# Patient Record
Sex: Female | Born: 1950 | Race: White | Hispanic: No | Marital: Married
Health system: Southern US, Community
[De-identification: ages and names within clinical notes are randomized; demographics above are authoritative.]

---

## 1998-05-17 ENCOUNTER — Ambulatory Visit (HOSPITAL_COMMUNITY): Admission: RE | Admit: 1998-05-17 | Discharge: 1998-05-17 | Payer: Self-pay

## 1998-10-02 ENCOUNTER — Ambulatory Visit (HOSPITAL_COMMUNITY): Admission: RE | Admit: 1998-10-02 | Discharge: 1998-10-02 | Payer: Self-pay

## 2000-04-01 ENCOUNTER — Other Ambulatory Visit: Admission: RE | Admit: 2000-04-01 | Discharge: 2000-04-01 | Payer: Self-pay | Admitting: Internal Medicine

## 2001-09-09 ENCOUNTER — Emergency Department (HOSPITAL_COMMUNITY): Admission: EM | Admit: 2001-09-09 | Discharge: 2001-09-10 | Payer: Self-pay | Admitting: Emergency Medicine

## 2001-10-24 ENCOUNTER — Other Ambulatory Visit: Admission: RE | Admit: 2001-10-24 | Discharge: 2001-10-24 | Payer: Self-pay | Admitting: Internal Medicine

## 2002-02-07 ENCOUNTER — Other Ambulatory Visit: Admission: RE | Admit: 2002-02-07 | Discharge: 2002-02-07 | Payer: Self-pay | Admitting: Internal Medicine

## 2002-03-13 ENCOUNTER — Encounter (INDEPENDENT_AMBULATORY_CARE_PROVIDER_SITE_OTHER): Payer: Self-pay | Admitting: Specialist

## 2002-03-13 ENCOUNTER — Ambulatory Visit (HOSPITAL_COMMUNITY): Admission: RE | Admit: 2002-03-13 | Discharge: 2002-03-13 | Payer: Self-pay | Admitting: *Deleted

## 2003-11-01 ENCOUNTER — Other Ambulatory Visit: Admission: RE | Admit: 2003-11-01 | Discharge: 2003-11-01 | Payer: Self-pay | Admitting: *Deleted

## 2007-09-26 ENCOUNTER — Ambulatory Visit (HOSPITAL_COMMUNITY): Admission: RE | Admit: 2007-09-26 | Discharge: 2007-09-26 | Payer: Self-pay | Admitting: *Deleted

## 2009-10-29 ENCOUNTER — Encounter: Admission: RE | Admit: 2009-10-29 | Discharge: 2009-10-29 | Payer: Self-pay | Admitting: Obstetrics and Gynecology

## 2010-12-23 ENCOUNTER — Encounter
Admission: RE | Admit: 2010-12-23 | Discharge: 2010-12-23 | Payer: Self-pay | Source: Home / Self Care | Attending: Obstetrics and Gynecology | Admitting: Obstetrics and Gynecology

## 2011-04-14 NOTE — Op Note (Signed)
NAME:  Melissa [name], Melissa [name]             ACCOUNT NO.:  [id number]   MEDICAL RECORD NO.:  [id number]          PATIENT TYPE:  AMB   LOCATION:  ENDO                         FACILITY:  [facility]   PHYSICIAN:  Georgiana Spinner, M.D.    DATE OF BIRTH:  05/07/1951   DATE OF PROCEDURE:  09/26/2007  DATE OF DISCHARGE:                               OPERATIVE REPORT   PROCEDURE:  Colonoscopy.   INDICATIONS:  Colon cancer screening.   ANESTHESIA:  Fentanyl 100 mcg, Versed 10 mg.   PROCEDURE:  With the patient mildly sedated in the left lateral  decubitus position, the Pentax videoscopic colonoscope was inserted into  the rectum and passed under direct vision with pressure applied to reach  the cecum, identified by ileocecal valve and the base of cecum, both of  which were photographed.  From this point, the colonoscope was slowly  withdrawn taking circumferential views of the colonic mucosa, stopping  only in the rectum which appeared normal on direct and retroflex view.  The endoscope was straightened and withdrawn.  The patient's vital  signs, and pulse oximeter remained stable.  The patient tolerated the  procedure well without apparent complications.   FINDINGS:  Unremarkable examination.  Because of the patient's family  history we will repeat examination in approximately 5 years           ______________________________  Georgiana Spinner, M.D.     GMO/MEDQ  D:  09/26/2007  T:  09/26/2007  Job:  [id number]

## 2011-04-17 NOTE — Procedures (Signed)
[facility]  Patient:    [name], Melissa Visit Number: [id number] MRN: [id number]          Service Type: END Location: ENDO Attending Physician:  Sabino Gasser Dictated by:   Sabino Gasser, M.D. Proc. Date: 03/13/02 Admit Date:  03/13/2002                             Procedure Report  PROCEDURE:  Upper endoscopy.  INDICATION:  Reflux.  ANESTHESIA:  Demerol 50, Versed 6 mg.  DESCRIPTION OF PROCEDURE:  With patient mildly sedated in the left lateral decubitus position, the Olympus videoscopic endoscope was inserted into the mouth and passed under direct vision through the esophagus which appeared normal into the stomach; fundus, body, antrum, duodenal bulb, and second portion of duodenum all appeared normal.  From this point, the endoscope was slowly withdrawn, taking circumferential views of the entire gastric and subsequently esophageal mucosa, all of which appeared normal.  The patients vital signs and pulse oximeter remained stable.  The patient tolerated the procedure well without apparent complications.  FINDINGS:  This is an unremarkable endoscopic examination.  PLAN:  Proceed to colonoscopy. Dictated by:   Sabino Gasser, M.D. Attending Physician:  Sabino Gasser DD:  03/13/02 TD:  03/13/02 Job: [id number]

## 2011-04-17 NOTE — Procedures (Signed)
[facility]  Patient:    Melissa [name], Melissa [name] Visit Number: [id number] MRN: [id number]          Service Type: END Location: ENDO Attending Physician:  Sabino Gasser Dictated by:   Sabino Gasser, M.D. Proc. Date: 03/13/02 Admit Date:  03/13/2002                             Procedure Report  PROCEDURE:  Colonoscopy.  INDICATION:  Colon cancer screening.  ANESTHESIA:  Versed 2 mg.  DESCRIPTION OF PROCEDURE:  With the patient mildly sedated in the left lateral decubitus position, the Olympus videoscopic colonoscope was inserted into the rectum and passed under direct vision to the cecum, identified by the ileocecal valve and appendiceal orifice.  Both of which were photographed. From this point, the colonoscope was slowly withdrawn, taking circumferential views of the entire colonic mucosa, stopping only at 30 cm from the anal verge at which point a small polyp was seen, photographed, and removed using hot biopsy forceps technique setting of 3-3 blended current.  The endoscope was then withdrawn to the rectum which appeared normal on direct and retroflexed view.  The endoscope was straightened and withdrawn.  The patients vital signs and pulse oximeter remained stable.  The patient tolerated the procedure well without apparent complications.  FINDINGS:  Small polyp at 30 cm.  PLAN: 1. Await biopsy report. 2. The patient will call me for results and follow up with me as an    outpatient. Dictated by:   Sabino Gasser, M.D. Attending Physician:  Sabino Gasser DD:  03/13/02 TD:  03/13/02 Job: [id number]

## 2011-12-02 ENCOUNTER — Other Ambulatory Visit: Payer: Self-pay | Admitting: Obstetrics and Gynecology

## 2011-12-02 DIAGNOSIS — Z1231 Encounter for screening mammogram for malignant neoplasm of breast: Secondary | ICD-10-CM

## 2011-12-25 ENCOUNTER — Ambulatory Visit: Payer: Self-pay

## 2012-01-04 ENCOUNTER — Ambulatory Visit
Admission: RE | Admit: 2012-01-04 | Discharge: 2012-01-04 | Disposition: A | Discharge status: Home / Self Care | Payer: PRIVATE HEALTH INSURANCE | Source: Ambulatory Visit | Attending: Obstetrics and Gynecology | Admitting: Obstetrics and Gynecology

## 2012-01-04 DIAGNOSIS — Z1231 Encounter for screening mammogram for malignant neoplasm of breast: Secondary | ICD-10-CM

## 2012-01-11 ENCOUNTER — Other Ambulatory Visit: Payer: Self-pay | Admitting: Obstetrics and Gynecology

## 2012-01-11 DIAGNOSIS — R928 Other abnormal and inconclusive findings on diagnostic imaging of breast: Secondary | ICD-10-CM

## 2012-01-20 ENCOUNTER — Ambulatory Visit
Admission: RE | Admit: 2012-01-20 | Discharge: 2012-01-20 | Disposition: A | Discharge status: Home / Self Care | Payer: PRIVATE HEALTH INSURANCE | Source: Ambulatory Visit | Attending: Obstetrics and Gynecology | Admitting: Obstetrics and Gynecology

## 2012-01-20 DIAGNOSIS — R928 Other abnormal and inconclusive findings on diagnostic imaging of breast: Secondary | ICD-10-CM

## 2012-06-20 ENCOUNTER — Other Ambulatory Visit: Payer: Self-pay | Admitting: Dermatology

## 2013-02-16 ENCOUNTER — Other Ambulatory Visit: Payer: Self-pay | Admitting: Dermatology

## 2013-02-20 ENCOUNTER — Other Ambulatory Visit: Payer: Self-pay

## 2013-02-20 DIAGNOSIS — Z1231 Encounter for screening mammogram for malignant neoplasm of breast: Secondary | ICD-10-CM

## 2013-03-15 ENCOUNTER — Ambulatory Visit
Admission: RE | Admit: 2013-03-15 | Discharge: 2013-03-15 | Disposition: A | Discharge status: Home / Self Care | Payer: Commercial Managed Care - PPO | Source: Ambulatory Visit

## 2013-03-15 DIAGNOSIS — Z1231 Encounter for screening mammogram for malignant neoplasm of breast: Secondary | ICD-10-CM

## 2014-09-17 ENCOUNTER — Other Ambulatory Visit: Payer: Self-pay | Admitting: Dermatology

## 2017-02-01 DIAGNOSIS — E559 Vitamin D deficiency, unspecified: Secondary | ICD-10-CM | POA: Diagnosis not present

## 2017-02-01 DIAGNOSIS — D72819 Decreased white blood cell count, unspecified: Secondary | ICD-10-CM | POA: Diagnosis not present

## 2017-02-01 DIAGNOSIS — M8580 Other specified disorders of bone density and structure, unspecified site: Secondary | ICD-10-CM | POA: Diagnosis not present

## 2017-02-02 DIAGNOSIS — R69 Illness, unspecified: Secondary | ICD-10-CM | POA: Diagnosis not present

## 2017-02-18 DIAGNOSIS — Z85828 Personal history of other malignant neoplasm of skin: Secondary | ICD-10-CM | POA: Diagnosis not present

## 2017-02-18 DIAGNOSIS — Z880 Allergy status to penicillin: Secondary | ICD-10-CM | POA: Diagnosis not present

## 2017-02-18 DIAGNOSIS — Z23 Encounter for immunization: Secondary | ICD-10-CM | POA: Diagnosis not present

## 2017-02-18 DIAGNOSIS — M8589 Other specified disorders of bone density and structure, multiple sites: Secondary | ICD-10-CM | POA: Diagnosis not present

## 2017-02-18 DIAGNOSIS — Z0000 Encounter for general adult medical examination without abnormal findings: Secondary | ICD-10-CM | POA: Diagnosis not present

## 2017-03-15 DIAGNOSIS — H5203 Hypermetropia, bilateral: Secondary | ICD-10-CM | POA: Diagnosis not present

## 2017-03-15 DIAGNOSIS — H524 Presbyopia: Secondary | ICD-10-CM | POA: Diagnosis not present

## 2017-03-15 DIAGNOSIS — H2513 Age-related nuclear cataract, bilateral: Secondary | ICD-10-CM | POA: Diagnosis not present

## 2017-04-12 DIAGNOSIS — M545 Low back pain: Secondary | ICD-10-CM | POA: Diagnosis not present

## 2017-04-16 DIAGNOSIS — M545 Low back pain: Secondary | ICD-10-CM | POA: Diagnosis not present

## 2017-04-30 DIAGNOSIS — M545 Low back pain: Secondary | ICD-10-CM | POA: Diagnosis not present

## 2017-05-05 DIAGNOSIS — M545 Low back pain: Secondary | ICD-10-CM | POA: Diagnosis not present

## 2017-05-12 DIAGNOSIS — M545 Low back pain: Secondary | ICD-10-CM | POA: Diagnosis not present

## 2017-05-20 DIAGNOSIS — M545 Low back pain: Secondary | ICD-10-CM | POA: Diagnosis not present

## 2017-06-15 DIAGNOSIS — M545 Low back pain: Secondary | ICD-10-CM | POA: Diagnosis not present

## 2017-06-30 DIAGNOSIS — M545 Low back pain: Secondary | ICD-10-CM | POA: Diagnosis not present

## 2017-07-14 DIAGNOSIS — M545 Low back pain: Secondary | ICD-10-CM | POA: Diagnosis not present

## 2017-07-28 DIAGNOSIS — M545 Low back pain: Secondary | ICD-10-CM | POA: Diagnosis not present

## 2017-08-11 DIAGNOSIS — R69 Illness, unspecified: Secondary | ICD-10-CM | POA: Diagnosis not present

## 2017-09-30 DIAGNOSIS — S51802A Unspecified open wound of left forearm, initial encounter: Secondary | ICD-10-CM | POA: Diagnosis not present

## 2017-09-30 DIAGNOSIS — S63502A Unspecified sprain of left wrist, initial encounter: Secondary | ICD-10-CM | POA: Diagnosis not present

## 2017-10-13 DIAGNOSIS — Z23 Encounter for immunization: Secondary | ICD-10-CM | POA: Diagnosis not present

## 2018-01-31 DIAGNOSIS — Z124 Encounter for screening for malignant neoplasm of cervix: Secondary | ICD-10-CM | POA: Diagnosis not present

## 2018-01-31 DIAGNOSIS — Z01419 Encounter for gynecological examination (general) (routine) without abnormal findings: Secondary | ICD-10-CM | POA: Diagnosis not present

## 2018-01-31 DIAGNOSIS — Z1231 Encounter for screening mammogram for malignant neoplasm of breast: Secondary | ICD-10-CM | POA: Diagnosis not present

## 2018-02-01 DIAGNOSIS — Z124 Encounter for screening for malignant neoplasm of cervix: Secondary | ICD-10-CM | POA: Diagnosis not present

## 2018-02-16 DIAGNOSIS — M8580 Other specified disorders of bone density and structure, unspecified site: Secondary | ICD-10-CM | POA: Diagnosis not present

## 2018-02-16 DIAGNOSIS — Z13220 Encounter for screening for lipoid disorders: Secondary | ICD-10-CM | POA: Diagnosis not present

## 2018-02-16 DIAGNOSIS — Z0000 Encounter for general adult medical examination without abnormal findings: Secondary | ICD-10-CM | POA: Diagnosis not present

## 2018-02-21 DIAGNOSIS — Z85820 Personal history of malignant melanoma of skin: Secondary | ICD-10-CM | POA: Diagnosis not present

## 2018-02-21 DIAGNOSIS — M8580 Other specified disorders of bone density and structure, unspecified site: Secondary | ICD-10-CM | POA: Diagnosis not present

## 2018-02-21 DIAGNOSIS — Z8639 Personal history of other endocrine, nutritional and metabolic disease: Secondary | ICD-10-CM | POA: Diagnosis not present

## 2018-02-21 DIAGNOSIS — Z803 Family history of malignant neoplasm of breast: Secondary | ICD-10-CM | POA: Diagnosis not present

## 2018-02-21 DIAGNOSIS — Z85828 Personal history of other malignant neoplasm of skin: Secondary | ICD-10-CM | POA: Diagnosis not present

## 2018-02-21 DIAGNOSIS — Z23 Encounter for immunization: Secondary | ICD-10-CM | POA: Diagnosis not present

## 2018-02-21 DIAGNOSIS — Z0000 Encounter for general adult medical examination without abnormal findings: Secondary | ICD-10-CM | POA: Diagnosis not present

## 2018-02-21 DIAGNOSIS — D72819 Decreased white blood cell count, unspecified: Secondary | ICD-10-CM | POA: Diagnosis not present

## 2018-02-22 DIAGNOSIS — R69 Illness, unspecified: Secondary | ICD-10-CM | POA: Diagnosis not present

## 2018-03-16 DIAGNOSIS — H04123 Dry eye syndrome of bilateral lacrimal glands: Secondary | ICD-10-CM | POA: Diagnosis not present

## 2018-03-16 DIAGNOSIS — H2513 Age-related nuclear cataract, bilateral: Secondary | ICD-10-CM | POA: Diagnosis not present

## 2018-03-16 DIAGNOSIS — H5203 Hypermetropia, bilateral: Secondary | ICD-10-CM | POA: Diagnosis not present

## 2018-03-16 DIAGNOSIS — H524 Presbyopia: Secondary | ICD-10-CM | POA: Diagnosis not present

## 2018-03-24 DIAGNOSIS — Z85820 Personal history of malignant melanoma of skin: Secondary | ICD-10-CM | POA: Diagnosis not present

## 2018-03-24 DIAGNOSIS — L821 Other seborrheic keratosis: Secondary | ICD-10-CM | POA: Diagnosis not present

## 2018-03-24 DIAGNOSIS — Z85828 Personal history of other malignant neoplasm of skin: Secondary | ICD-10-CM | POA: Diagnosis not present

## 2018-03-24 DIAGNOSIS — D485 Neoplasm of uncertain behavior of skin: Secondary | ICD-10-CM | POA: Diagnosis not present

## 2018-03-24 DIAGNOSIS — D2271 Melanocytic nevi of right lower limb, including hip: Secondary | ICD-10-CM | POA: Diagnosis not present

## 2018-03-24 DIAGNOSIS — Z86018 Personal history of other benign neoplasm: Secondary | ICD-10-CM | POA: Diagnosis not present

## 2018-03-24 DIAGNOSIS — D225 Melanocytic nevi of trunk: Secondary | ICD-10-CM | POA: Diagnosis not present

## 2018-04-14 DIAGNOSIS — L988 Other specified disorders of the skin and subcutaneous tissue: Secondary | ICD-10-CM | POA: Diagnosis not present

## 2018-04-14 DIAGNOSIS — D485 Neoplasm of uncertain behavior of skin: Secondary | ICD-10-CM | POA: Diagnosis not present

## 2018-08-31 DIAGNOSIS — R69 Illness, unspecified: Secondary | ICD-10-CM | POA: Diagnosis not present

## 2018-09-16 DIAGNOSIS — Z23 Encounter for immunization: Secondary | ICD-10-CM | POA: Diagnosis not present

## 2019-05-02 DIAGNOSIS — H524 Presbyopia: Secondary | ICD-10-CM | POA: Diagnosis not present

## 2019-05-02 DIAGNOSIS — H5203 Hypermetropia, bilateral: Secondary | ICD-10-CM | POA: Diagnosis not present

## 2019-05-02 DIAGNOSIS — H04123 Dry eye syndrome of bilateral lacrimal glands: Secondary | ICD-10-CM | POA: Diagnosis not present

## 2019-05-02 DIAGNOSIS — H2513 Age-related nuclear cataract, bilateral: Secondary | ICD-10-CM | POA: Diagnosis not present

## 2019-05-30 DIAGNOSIS — R69 Illness, unspecified: Secondary | ICD-10-CM | POA: Diagnosis not present

## 2019-08-25 DIAGNOSIS — Z23 Encounter for immunization: Secondary | ICD-10-CM | POA: Diagnosis not present

## 2019-11-30 ENCOUNTER — Other Ambulatory Visit: Payer: Self-pay

## 2019-11-30 ENCOUNTER — Ambulatory Visit: Payer: Self-pay | Attending: Internal Medicine

## 2019-11-30 DIAGNOSIS — Z20822 Contact with and (suspected) exposure to covid-19: Secondary | ICD-10-CM

## 2019-11-30 DIAGNOSIS — Z20828 Contact with and (suspected) exposure to other viral communicable diseases: Secondary | ICD-10-CM | POA: Insufficient documentation

## 2019-12-02 LAB — NOVEL CORONAVIRUS, NAA: SARS-CoV-2, NAA: NOT DETECTED

## 2019-12-20 ENCOUNTER — Ambulatory Visit: Payer: Self-pay | Attending: Internal Medicine

## 2019-12-20 DIAGNOSIS — R69 Illness, unspecified: Secondary | ICD-10-CM | POA: Diagnosis not present

## 2019-12-20 DIAGNOSIS — Z23 Encounter for immunization: Secondary | ICD-10-CM | POA: Insufficient documentation

## 2019-12-20 NOTE — Progress Notes (Signed)
   Covid-19 Vaccination Clinic  Name:  Melissa [name]    MRN: [id number] DOB: [birth year]  12/20/2019  [name] was observed post Covid-19 immunization for 15 minutes without incidence. She was provided with Vaccine Information Sheet and instruction to access the V-Safe system.   [name] was instructed to call 911 with any severe reactions post vaccine: Marland Kitchen Difficulty breathing  . Swelling of your face and throat  . A fast heartbeat  . A bad rash all over your body  . Dizziness and weakness    Immunizations Administered    Name Date Dose VIS Date Route   Pfizer COVID-19 Vaccine 12/20/2019 10:36 AM 0.3 mL 11/10/2019 Intramuscular   Manufacturer: [place]   Lot: [id number]   [place]: [id number]

## 2020-01-08 ENCOUNTER — Ambulatory Visit: Payer: Self-pay | Attending: Internal Medicine

## 2020-01-08 DIAGNOSIS — Z23 Encounter for immunization: Secondary | ICD-10-CM | POA: Insufficient documentation

## 2020-01-08 NOTE — Progress Notes (Signed)
   Covid-19 Vaccination Clinic  Name:  ARIEA AZZARA    MRN: [id number] DOB: Jan 21, 1951  01/08/2020  Ms. Deshay was observed post Covid-19 immunization for 30 minutes based on pre-vaccination screening without incidence. She was provided with Vaccine Information Sheet and instruction to access the V-Safe system.   Ms. Lawrenson was instructed to call 911 with any severe reactions post vaccine: Marland Kitchen Difficulty breathing  . Swelling of your face and throat  . A fast heartbeat  . A bad rash all over your body  . Dizziness and weakness    Immunizations Administered    Name Date Dose VIS Date Route   Pfizer COVID-19 Vaccine 01/08/2020  3:39 PM 0.3 mL 11/10/2019 Intramuscular   Manufacturer: [place]   Lot: [id number]   [place]: [id number]

## 2020-02-21 DIAGNOSIS — D72819 Decreased white blood cell count, unspecified: Secondary | ICD-10-CM | POA: Diagnosis not present

## 2020-02-21 DIAGNOSIS — Z0000 Encounter for general adult medical examination without abnormal findings: Secondary | ICD-10-CM | POA: Diagnosis not present

## 2020-02-28 DIAGNOSIS — Z0000 Encounter for general adult medical examination without abnormal findings: Secondary | ICD-10-CM | POA: Diagnosis not present

## 2020-02-28 DIAGNOSIS — Z85820 Personal history of malignant melanoma of skin: Secondary | ICD-10-CM | POA: Diagnosis not present

## 2020-02-28 DIAGNOSIS — M8589 Other specified disorders of bone density and structure, multiple sites: Secondary | ICD-10-CM | POA: Diagnosis not present

## 2020-02-28 DIAGNOSIS — M8580 Other specified disorders of bone density and structure, unspecified site: Secondary | ICD-10-CM | POA: Diagnosis not present

## 2020-02-28 DIAGNOSIS — M25512 Pain in left shoulder: Secondary | ICD-10-CM | POA: Diagnosis not present

## 2020-02-28 DIAGNOSIS — Z85828 Personal history of other malignant neoplasm of skin: Secondary | ICD-10-CM | POA: Diagnosis not present

## 2020-02-28 DIAGNOSIS — S80812A Abrasion, left lower leg, initial encounter: Secondary | ICD-10-CM | POA: Diagnosis not present

## 2020-03-08 ENCOUNTER — Other Ambulatory Visit: Payer: Self-pay

## 2020-03-08 ENCOUNTER — Ambulatory Visit: Payer: Medicare HMO | Admitting: Family Medicine

## 2020-03-08 ENCOUNTER — Ambulatory Visit (INDEPENDENT_AMBULATORY_CARE_PROVIDER_SITE_OTHER): Payer: Medicare HMO

## 2020-03-08 ENCOUNTER — Other Ambulatory Visit: Payer: Self-pay | Admitting: Internal Medicine

## 2020-03-08 VITALS — BP 118/82 | HR 74 | Ht 64.0 in | Wt 137.0 lb

## 2020-03-08 DIAGNOSIS — M25551 Pain in right hip: Secondary | ICD-10-CM | POA: Diagnosis not present

## 2020-03-08 DIAGNOSIS — M25512 Pain in left shoulder: Secondary | ICD-10-CM | POA: Diagnosis not present

## 2020-03-08 DIAGNOSIS — Z1231 Encounter for screening mammogram for malignant neoplasm of breast: Secondary | ICD-10-CM

## 2020-03-08 NOTE — Progress Notes (Signed)
Subjective:   I, Melissa [name], am serving as a scribe for Dr. Hulan Saas. This visit occurred during the SARS-CoV-2 public health emergency.  Safety protocols were in place, including screening questions prior to the visit, additional usage of staff PPE, and extensive cleaning of exam room while observing appropriate contact time as indicated for disinfecting solutions.   CC: L shoulder pain  I, Melissa [name], LAT, ATC, am serving as scribe for Dr. Lynne Leader.  HPI: Pt is a 69 y/o female presenting w/ c/o L shoulder pain after tripping while running last winter. No fall. Pain in left scapula and posterior aspect of shoulder. Reaching over head and putting her bra on increases her pain. Denies any radiating symptoms.     She also injured her R groin during this episode. Feels like her leg is going to give out at times but is able to run without pain. Walking is worse especially downhill.   Radiating pain: L shoulder mechanical symptoms: Aggravating factors: Treatments tried:  Pertinent review of Systems: No fevers or chills  Relevant historical information: Avid runner.  Plans to run a half marathon in May at [place] in [place]   Objective:    Vitals:   03/08/20 0810  BP: 118/82  Pulse: 74  SpO2: 98%   General: Well Developed, well nourished, and in no acute distress.   MSK:  C-spine: Normal.  Normal cervical motion appreciably strength intact. Left shoulder normal-appearing nontender normal abduction and external rotation range of motion.  Internal rotation range of motion limited to lumbar spine. Strength intact abduction external/internal rotation. Negative Hawkins and Neer's test. Negative Yergason's and speeds test.  Contralateral right shoulder normal-appearing nontender normal motion internal rotation to thoracic spine. Normal strength negative impingement testing.  Pulses cap refill and sensation intact upper extremities bilaterally.  Right  hip normal-appearing nontender. Normal motion pain with flexion and internal rotation. Intact strength without pain.  Leg length equal bilaterally.  Lab and Radiology Results  X-ray images left shoulder and right hip obtained today personally and independently reviewed  Left shoulder: Mild degenerative changes no acute fractures.  Right hip: Largely normal-appearing no acute fractures.  Minimal degenerative changes.  Await formal radiology review   Impression and Recommendations:    Assessment and Plan: 69 y.o. female with  Left shoulder pain: Ongoing for months now.  Patient likely has a little impingement with some scapular dysfunction.  Plan for trial of physical therapy and recheck in about 6 weeks.  Anterior hip pain: Somewhat concerning for intra-articular hip pain or labrum tear.  X-ray today shows a little bit of degenerative changes but nothing severe per my read.  Overread is still pending.  Again trial of physical therapy.  Recheck if not improved will consider injection if not better.Marland Kitchen  PDMP not reviewed this encounter. Orders Placed This Encounter  Procedures  . DG Shoulder Left    Standing Status:   Future    Number of Occurrences:   1    Standing Expiration Date:   05/08/2021    Order Specific Question:   Reason for Exam (SYMPTOM  OR DIAGNOSIS REQUIRED)    Answer:   eval left shoulder pain x months    Order Specific Question:   Preferred imaging location?    Answer:   [facility]    Order Specific Question:   Radiology Contrast Protocol - do NOT remove file path    Answer:   \\[email or web address]\[email or web address]\Radiant\DXFluoroContrastProtocols.pdf  . DG HIP UNILAT WITH  PELVIS 2-3 VIEWS RIGHT    Standing Status:   Future    Number of Occurrences:   1    Standing Expiration Date:   05/08/2021    Order Specific Question:   Reason for Exam (SYMPTOM  OR DIAGNOSIS REQUIRED)    Answer:   eval right anterior hip pain    Order Specific Question:   Preferred imaging location?      Answer:   [facility]    Order Specific Question:   Radiology Contrast Protocol - do NOT remove file path    Answer:   \\[email or web address]\[email or web address]\Radiant\DXFluoroContrastProtocols.pdf  . Ambulatory referral to Physical Therapy    Referral Priority:   Routine    Referral Type:   Physical Medicine    Referral Reason:   Specialty Services Required    Requested Specialty:   Physical Therapy   No orders of the defined types were placed in this encounter.   Discussed warning signs or symptoms. Please see discharge instructions. Patient expresses understanding.   The above documentation has been reviewed and is accurate and complete Lynne Leader   Will send note to PCP Dr Conley [facility] [address] [place], [place] [address] Phone: [phone] Fax: [phone]

## 2020-03-08 NOTE — Progress Notes (Signed)
X-ray left shoulder shows no fractures.  Looks pretty normal per radiology.

## 2020-03-08 NOTE — Progress Notes (Signed)
X-ray right hip no fracture no significant arthritis.  Normal per radiology.

## 2020-03-08 NOTE — Patient Instructions (Addendum)
Thank you for coming in today.  Plan for shoulder and hip PT.  Get xray today on your way out.  Recheck with me prior your 1/2 marathon in May.  Let Lauren know about your running plan.   You should hear from my office soon about Xray results.   Bring your running gear with you so I can look at your run.

## 2020-03-20 ENCOUNTER — Other Ambulatory Visit: Payer: Self-pay

## 2020-03-20 ENCOUNTER — Ambulatory Visit: Payer: Medicare HMO | Admitting: Physical Therapy

## 2020-03-20 DIAGNOSIS — M25512 Pain in left shoulder: Secondary | ICD-10-CM

## 2020-03-20 DIAGNOSIS — M25551 Pain in right hip: Secondary | ICD-10-CM | POA: Diagnosis not present

## 2020-03-21 ENCOUNTER — Encounter: Payer: Medicare HMO | Admitting: Physical Therapy

## 2020-03-22 ENCOUNTER — Ambulatory Visit: Payer: Medicare HMO | Admitting: Physical Therapy

## 2020-03-22 ENCOUNTER — Other Ambulatory Visit: Payer: Self-pay

## 2020-03-22 DIAGNOSIS — M25512 Pain in left shoulder: Secondary | ICD-10-CM

## 2020-03-22 DIAGNOSIS — M25551 Pain in right hip: Secondary | ICD-10-CM

## 2020-03-22 NOTE — Patient Instructions (Signed)
Access Code: [email or web address] URL: [email or web address] Date: 03/22/2020 Prepared by: Lyndee Hensen  Exercises Supine Shoulder Flexion with Dowel - 2 x daily - 2 sets - 10 reps Supine Shoulder External Rotation with Dowel - 2 x daily - 2 sets - 10 reps Shoulder External Rotation with Anchored Resistance - 1 x daily - 2 sets - 10 reps Shoulder Internal Rotation with Resistance - 1 x daily - 2 sets - 10 reps Scapular Retraction with Resistance - 1 x daily - 2 sets - 10 reps Supine Single Knee to Chest Stretch - 2 x daily - 3 reps - 30 hold Supine Figure 4 Piriformis Stretch - 2 x daily - 3 reps - 30 hold Half Kneeling Hip Flexor Stretch - 2 x daily - 3 reps - 30 hold Sidelying Hip Abduction - 1 x daily - 2 sets - 10 reps Standing Repeated Hip Abduction with Resistance - 1 x daily - 2 sets - 10 reps Hooklying Isometric Clamshell - 1 x daily - 2 sets - 10 reps Supine Bridge - 1 x daily - 2 sets - 10 reps Single Leg Running Balance - 1 x daily - 1 sets - 10 reps Forward Reach - 1 x daily - 1 sets - 10 reps

## 2020-03-23 ENCOUNTER — Encounter: Payer: Self-pay | Admitting: Physical Therapy

## 2020-03-23 NOTE — Patient Instructions (Addendum)
Access Code: [email or web address] URL: [email or web address] Date: 03/22/2020 Prepared by: Lyndee Hensen  Exercises Supine Shoulder Flexion with Dowel - 2 x daily - 2 sets - 10 reps Supine Shoulder External Rotation with Dowel - 2 x daily - 2 sets - 10 reps   Supine Single Knee to Chest Stretch - 2 x daily - 3 reps - 30 hold Supine Figure 4 Piriformis Stretch - 2 x daily - 3 reps - 30 hold Half Kneeling Hip Flexor Stretch - 2 x daily - 3 reps - 30 hold

## 2020-03-23 NOTE — Therapy (Signed)
[facility] [address] [place], [place], [address] Phone: [phone]   Fax:  [phone]  Physical Therapy Treatment  Patient Details  Name: Melissa [name] MRN: [id number] Date of Birth: 1951/01/07 Referring Provider (PT): Lynne Leader   Encounter Date: 03/22/2020  PT End of Session - 03/23/20 1319    Visit Number  2    Number of Visits  12    Date for PT Re-Evaluation  05/01/20    Authorization Type  [employer]    PT Start Time  1301    PT Stop Time  1344    PT Time Calculation (min)  43 min    Activity Tolerance  Patient tolerated treatment well    Behavior During Therapy  [facility] for tasks assessed/performed       History reviewed. No pertinent past medical history.  History reviewed. No pertinent surgical history.  There were no vitals filed for this visit.  Subjective Assessment - 03/23/20 1318    Subjective  Pt states mild soreness in shoulder. Hip not too bad today. Pt running 1/2 marathon on May 2.    Currently in Pain?  Yes    Pain Score  4     Pain Location  Shoulder    Pain Orientation  Left    Pain Descriptors / Indicators  Aching    Pain Type  Acute pain    Pain Onset  More than a month ago    Pain Frequency  Intermittent    Pain Score  3    Pain Location  Hip    Pain Orientation  Right    Pain Descriptors / Indicators  Aching    Pain Type  Acute pain    Pain Onset  More than a month ago    Pain Frequency  Intermittent                       [facility] Adult PT Treatment/Exercise - 03/23/20 1322      Ambulation/Gait   Gait Comments  Gait: unremarkable, Running mechanis, mild decrease in hip extension on R,       Exercises   Exercises  Shoulder;Knee/Hip      Knee/Hip Exercises: Stretches   Piriformis Stretch  2 reps;30 seconds    Piriformis Stretch Limitations  supine     Other Knee/Hip Stretches  SKTC 30 sec x 3;      Other Knee/Hip Stretches  Hip flexor kneeling stretch 30 sec x 3  bil;       Knee/Hip Exercises: Standing   Hip Abduction  2 sets;10 reps    Abduction Limitations  YTB    SLS with Vectors  SLS with reach fwd x10 bil; SLS with runners lunge x10 bil;       Knee/Hip Exercises: Supine   Bridges  15 reps    Other Supine Knee/Hip Exercises  Clams GTB x20       Knee/Hip Exercises: Sidelying   Hip ABduction  10 reps;Both      Shoulder Exercises: Supine   Flexion  15 reps;AAROM    Flexion Limitations  cane      Shoulder Exercises: Standing   External Rotation  15 reps    Theraband Level (Shoulder External Rotation)  Level 2 (Red)    Internal Rotation  15 reps    Theraband Level (Shoulder Internal Rotation)  Level 2 (Red)    Row  20 reps    Theraband Level (Shoulder Row)  Level 3 (Green)      Shoulder Exercises: Stretch   Internal Rotation Stretch  5 reps    Internal Rotation Stretch Limitations  behind back, 10 sec holds      Manual Therapy   Manual Therapy  Joint mobilization;Passive ROM;Taping    Joint Mobilization  HIp Inf and post mobs, LAD  On R;     Passive ROM  PROM for L shoulder , all motions. IR mob behind back.     Kinesiotex  Ligament Correction      Kinesiotix   Ligament Correction  1 I strip and 1 Y strip for shoulder stabiliy              PT Education - 03/23/20 1319    Education Details  HEP updated    Person(s) Educated  Patient    Methods  Explanation;Demonstration;Handout;Verbal cues    Comprehension  Verbalized understanding;Returned demonstration;Verbal cues required;Tactile cues required;Need further instruction       PT Short Term Goals - 03/23/20 1251      PT SHORT TERM GOAL #1   Title  Pt to be independent with initial HEP    Time  2    Period  Weeks    Status  New    Target Date  04/03/20        PT Long Term Goals - 03/23/20 1253      PT LONG TERM GOAL #1   Title  Pt to be independent wtih final HEP    Time  6    Period  Weeks    Status  New    Target Date  05/02/20      PT LONG TERM GOAL  #2   Title  Pt to report decreased pain in L shoulder and R hip,  to 0-2/10 with exercise and IADLS.    Time  6    Period  Weeks    Status  New    Target Date  05/02/20      PT LONG TERM GOAL #3   Title  Pt to demo full AROM of L shoulder without pain, to improve ability for IADLs.    Time  6    Period  Weeks    Status  New    Target Date  05/02/20      PT LONG TERM GOAL #4   Title  Pt to demo improved strength and stability in R hip to be 5/5, and WNL for [facility]. , to improve ability for functional activities.    Time  6    Period  Weeks    Status  New    Target Date  05/02/20            Plan - 03/23/20 1320    Clinical Impression Statement  Focus on hip mobilization for pain, and start of hip strength and stabilization. Will resume hip strengthening after pts race, as to not create DOMS prior to race next week. Pt with mild improvements in shoulder ROM today, still sore with full ROM. Trial for stability taping for L shoulder today. Will assess effects and progress HEp next visit.    Examination-Activity Limitations  Reach Overhead;Squat;Stairs;Lift    Examination-Participation Restrictions  Cleaning;Driving;Shop;Yard Work    Stability/Clinical Decision Making  Stable/Uncomplicated    Rehab Potential  Good    PT Frequency  2x / week    PT Duration  6 weeks    PT Treatment/Interventions  ADLs/Self Care Home Management;Cryotherapy;Electrical Stimulation;Gait training;DME Instruction;Ultrasound;Traction;Moist Heat;Iontophoresis  4mg /ml Dexamethasone;Stair training;Functional mobility training;Therapeutic activities;Therapeutic exercise;Balance training;Neuromuscular re-education;Manual techniques;Patient/family education;Orthotic Fit/Training;Passive range of motion;Dry needling;Taping;Vasopneumatic Device;Spinal Manipulations;Joint Manipulations    Consulted and Agree with Plan of Care  Patient       Patient will benefit from skilled therapeutic intervention in order to improve  the following deficits and impairments:  Decreased range of motion, Decreased activity tolerance, Pain, Impaired flexibility, Decreased strength, Decreased mobility  Visit Diagnosis: Acute pain of left shoulder  Pain in right hip     Problem List There are no problems to display for this patient.   Lyndee Hensen, PT, DPT 1:26 PM  03/23/20    [place], [place], [address] Phone: [phone]   Fax:  [phone]  Name: JUREA CECCARELLI MRN: [id number] Date of Birth: Jun 17, 1951

## 2020-03-23 NOTE — Therapy (Signed)
[facility] [address] [place], [place], [address] Phone: [phone]   Fax:  [phone]  Physical Therapy Evaluation  Patient Details  Name: Melissa [name] MRN: [id number] Date of Birth: January 01, 1951 Referring Provider (PT): Lynne Leader   Encounter Date: 03/20/2020  PT End of Session - 03/23/20 1248    Visit Number  1    Number of Visits  12    Date for PT Re-Evaluation  05/01/20    Authorization Type  [employer] Medicare    PT Start Time  [id number]    PT Stop Time  1600    PT Time Calculation (min)  44 min    Activity Tolerance  Patient tolerated treatment well    Behavior During Therapy  [facility] for tasks assessed/performed       History reviewed. No pertinent past medical history.  History reviewed. No pertinent surgical history.  There were no vitals filed for this visit.       [facility] PT Assessment - 03/23/20 0001      Assessment   Medical Diagnosis  L shoulder pain/ R hip pain    Referring Provider (PT)  Ellard Artis corey    Hand Dominance  Right    Prior Therapy  no      Balance Screen   Has the patient fallen in the past 6 months  No      Prior Function   Level of Independence  Independent      Cognition   Overall Cognitive Status  Within Functional Limits for tasks assessed      ROM / Strength   AROM / PROM / Strength  AROM;Strength      AROM   Overall AROM Comments  R hip: WFL ;  L shoulder, mild limitation for elevation, mod limitation for IR behind back with pain.     AROM Assessment Site  Shoulder      Strength   Overall Strength Comments  L shoulder: elevation: 4-/5, rotation: 4+/5;   R hip: 4-/5, L hip: 4+/5       Palpation   Palpation comment  Mild tenderness in anterior hip, minimal tenderness of hip flexor or groin,  Soreness in L anterior superior shoulder.       Special Tests   Other special tests  SLS: increased sway on R, SLS with rotation, significant sway on R vs L;  Pain with Fadir, Neg SI ,        Ambulation/Gait   Gait Comments  Gait: unremarkable, Running mechanis, mild decrease in hip extension on R,                 Objective measurements completed on examination: See above findings.      [facility] PT Treatment/Exercise - 03/23/20 0001      Exercises   Exercises  Shoulder;Knee/Hip      Knee/Hip Exercises: Stretches   Piriformis Stretch  2 reps;30 seconds    Piriformis Stretch Limitations  supine     Other Knee/Hip Stretches  SKTC 30 sec x 3;      Other Knee/Hip Stretches  Hip flexor kneeling stretch 30 sec x 3 bil;       Shoulder Exercises: Supine   Flexion  15 reps;AAROM    Flexion Limitations  cane      Manual Therapy   Manual Therapy  Joint mobilization;Passive ROM    Joint Mobilization  HIp Inf and post mobs, LAD  On R;     Passive  ROM  PROM for L shoulder , all motions. IR mob behind back.              PT Education - 03/23/20 1247    Education Details  PT POC, Exam findings. initial HEP    Person(s) Educated  Patient    Methods  Explanation;Demonstration;Tactile cues;Verbal cues;Handout    Comprehension  Verbalized understanding;Returned demonstration;Tactile cues required;Verbal cues required;Need further instruction       PT Short Term Goals - 03/23/20 1251      PT SHORT TERM GOAL #1   Title  Pt to be independent with initial HEP    Time  2    Period  Weeks    Status  New    Target Date  04/03/20        PT Long Term Goals - 03/23/20 1253      PT LONG TERM GOAL #1   Title  Pt to be independent wtih final HEP    Time  6    Period  Weeks    Status  New    Target Date  05/02/20      PT LONG TERM GOAL #2   Title  Pt to report decreased pain in L shoulder and R hip,  to 0-2/10 with exercise and IADLS.    Time  6    Period  Weeks    Status  New    Target Date  05/02/20      PT LONG TERM GOAL #3   Title  Pt to demo full AROM of L shoulder without pain, to improve ability for IADLs.    Time  6    Period  Weeks     Status  New    Target Date  05/02/20      PT LONG TERM GOAL #4   Title  Pt to demo improved strength and stability in R hip to be 5/5, and WNL for [facility]. , to improve ability for functional activities.    Time  6    Period  Weeks    Status  New    Target Date  05/02/20             Plan - 03/23/20 1258    Clinical Impression Statement  Pt presents wtih primary complaint of increased pain in L shoulder and R hip. Pt with most soreness in anterior hip, with weight bearing and rotation motions. Pt with noted weakness and instability in R hip vs L. Running mechanics with mild decrease in hip flexion, but otherwise minimal deficits. Expect more deficit when pt is fatigued. Pt with mild stiffness and pain in L shoulder with full elevation and IR behind back. Pt with decreased ability for full functional activites, due to pain, and will benefit from skilled PT to improve. pt also with lack of effective HEP for her Dx.    Examination-Activity Limitations  Reach Overhead;Squat;Stairs;Lift    Examination-Participation Restrictions  Cleaning;Driving;Shop;Yard Work    Stability/Clinical Decision Making  Stable/Uncomplicated    Clinical Decision Making  Low    Rehab Potential  Good    PT Frequency  2x / week    PT Duration  6 weeks    PT Treatment/Interventions  ADLs/Self Care Home Management;Cryotherapy;Electrical Stimulation;Gait training;DME Instruction;Ultrasound;Traction;Moist Heat;Iontophoresis 4mg /ml Dexamethasone;Stair training;Functional mobility training;Therapeutic activities;Therapeutic exercise;Balance training;Neuromuscular re-education;Manual techniques;Patient/family education;Orthotic Fit/Training;Passive range of motion;Dry needling;Taping;Vasopneumatic Device;Spinal Manipulations;Joint Manipulations    Consulted and Agree with Plan of Care  Patient       Patient will  benefit from skilled therapeutic intervention in order to improve the following deficits and impairments:  Decreased  range of motion, Decreased activity tolerance, Pain, Impaired flexibility, Decreased strength, Decreased mobility  Visit Diagnosis: Acute pain of left shoulder  Pain in right hip     Problem List There are no problems to display for this patient.   Lyndee Hensen, PT, DPT 1:11 PM  03/23/20    [place], [place], [address] Phone: [phone]   Fax:  [phone]  Name: ESTEFANIA GERECKE MRN: [id number] Date of Birth: 1951-11-07

## 2020-03-26 ENCOUNTER — Encounter: Payer: Self-pay | Admitting: Physical Therapy

## 2020-03-26 ENCOUNTER — Other Ambulatory Visit: Payer: Self-pay

## 2020-03-26 ENCOUNTER — Ambulatory Visit (INDEPENDENT_AMBULATORY_CARE_PROVIDER_SITE_OTHER): Payer: Medicare HMO | Admitting: Physical Therapy

## 2020-03-26 DIAGNOSIS — M25551 Pain in right hip: Secondary | ICD-10-CM | POA: Diagnosis not present

## 2020-03-26 DIAGNOSIS — M25512 Pain in left shoulder: Secondary | ICD-10-CM | POA: Diagnosis not present

## 2020-03-26 NOTE — Therapy (Signed)
[facility] [address] [place], [place], [address] Phone: [phone]   Fax:  [phone]  Physical Therapy Treatment  Patient Details  Name: Melissa [name] MRN: [id number] Date of Birth: [birth year] Referring Provider (PT): Lynne Leader   Encounter Date: 03/26/2020  PT End of Session - 03/26/20 1048    Visit Number  3    Number of Visits  12    Date for PT Re-Evaluation  05/01/20    Authorization Type  A[employer]na Medicare    PT Start Time  [phone]    PT Stop Time  1015    PT Time Calculation (min)  44 min    Activity Tolerance  Patient tolerated treatment well    Behavior During Therapy  [facility] for tasks assessed/performed       History reviewed. No pertinent past medical history.  History reviewed. No pertinent surgical history.  There were no vitals filed for this visit.  Subjective Assessment - 03/26/20 1046    Subjective  Pt with mild soreness in shoulder, thinks motion is improving with ther ex. Hip only sore with certain movments, "catches" with rotation.    Currently in Pain?  Yes    Pain Score  2     Pain Location  Shoulder    Pain Orientation  Left    Pain Descriptors / Indicators  Aching    Pain Type  Acute pain    Pain Onset  More than a month ago    Pain Frequency  Intermittent    Pain Score  3    Pain Location  Hip    Pain Orientation  Right    Pain Descriptors / Indicators  Aching    Pain Type  Acute pain    Pain Onset  More than a month ago    Pain Frequency  Intermittent                       [facility] Adult PT Treatment/Exercise - 03/26/20 0933      Ambulation/Gait   Gait Comments  Gait: unremarkable, Running mechanis, mild decrease in hip extension on R,       Exercises   Exercises  Shoulder;Knee/Hip      Knee/Hip Exercises: Stretches   Piriformis Stretch  2 reps;30 seconds    Piriformis Stretch Limitations  supine     Other Knee/Hip Stretches  --    Other Knee/Hip Stretches  --      Knee/Hip Exercises: Standing   Hip Abduction  --    Abduction Limitations  --    SLS with Vectors  --      Knee/Hip Exercises: Supine   Bridges  --    Other Supine Knee/Hip Exercises  --      Knee/Hip Exercises: Sidelying   Hip ABduction  --      Shoulder Exercises: Supine   External Rotation  20 reps    External Rotation Weight (lbs)  2    Flexion  15 reps;AAROM    Flexion Limitations  cane    Other Supine Exercises  AAROM: ER, 45 deg x15;       Shoulder Exercises: Standing   External Rotation  15 reps    Theraband Level (Shoulder External Rotation)  Level 2 (Red)    Internal Rotation  15 reps    Theraband Level (Shoulder Internal Rotation)  Level 2 (Red)    Row  20 reps    Theraband Level (Shoulder Row)  Level 3 (Green)      Shoulder Exercises: Pulleys   Flexion  2 minutes      Shoulder Exercises: Stretch   Internal Rotation Stretch  --    Internal Rotation Stretch Limitations  --      Manual Therapy   Manual Therapy  Joint mobilization;Passive ROM;Taping    Joint Mobilization  HIp Inf and post mobs, Inf and lateral mobs w strap;  long leg distraction for R hip pump x3 min;  L shoulder GHJ mobs inf and post,     Passive ROM  PROM for L shoulder , all motions.     Kinesiotex  --      Kinesiotix   Ligament Correction  --               PT Short Term Goals - 03/23/20 1251      PT SHORT TERM GOAL #1   Title  Pt to be independent with initial HEP    Time  2    Period  Weeks    Status  New    Target Date  04/03/20        PT Long Term Goals - 03/23/20 1253      PT LONG TERM GOAL #1   Title  Pt to be independent wtih final HEP    Time  6    Period  Weeks    Status  New    Target Date  05/02/20      PT LONG TERM GOAL #2   Title  Pt to report decreased pain in L shoulder and R hip,  to 0-2/10 with exercise and IADLS.    Time  6    Period  Weeks    Status  New    Target Date  05/02/20      PT LONG TERM GOAL #3   Title  Pt to demo full AROM of L  shoulder without pain, to improve ability for IADLs.    Time  6    Period  Weeks    Status  New    Target Date  05/02/20      PT LONG TERM GOAL #4   Title  Pt to demo improved strength and stability in R hip to be 5/5, and WNL for [facility]. , to improve ability for functional activities.    Time  6    Period  Weeks    Status  New    Target Date  05/02/20            Plan - 03/26/20 1049    Clinical Impression Statement  Pt with minimal soreness wtih ther ex for shoulder ROM and strengthening today. Improved PROM and improved ability for strengthening. Manual for hip pain today, limited strengthening, will resume strengthening after pts race this weekend. Discussed stability taping for shoulder for race.    Examination-Activity Limitations  Reach Overhead;Squat;Stairs;Lift    Examination-Participation Restrictions  Cleaning;Driving;Shop;Yard Work    Stability/Clinical Decision Making  Stable/Uncomplicated    Rehab Potential  Good    PT Frequency  2x / week    PT Duration  6 weeks    PT Treatment/Interventions  ADLs/Self Care Home Management;Cryotherapy;Electrical Stimulation;Gait training;DME Instruction;Ultrasound;Traction;Moist Heat;Iontophoresis 4mg /ml Dexamethasone;Stair training;Functional mobility training;Therapeutic activities;Therapeutic exercise;Balance training;Neuromuscular re-education;Manual techniques;Patient/family education;Orthotic Fit/Training;Passive range of motion;Dry needling;Taping;Vasopneumatic Device;Spinal Manipulations;Joint Manipulations    Consulted and Agree with Plan of Care  Patient       Patient will benefit from skilled therapeutic intervention in order to improve  the following deficits and impairments:  Decreased range of motion, Decreased activity tolerance, Pain, Impaired flexibility, Decreased strength, Decreased mobility  Visit Diagnosis: Acute pain of left shoulder  Pain in right hip     Problem List There are no problems to display for  this patient.   Lyndee Hensen, PT, DPT 10:53 AM  03/26/20    [facility] [place], [place], [address] Phone: [phone]   Fax:  [phone]  Name: LAKITHA STROMME MRN: [id number] Date of Birth: 1951/04/08

## 2020-03-28 ENCOUNTER — Encounter: Payer: Medicare HMO | Admitting: Physical Therapy

## 2020-03-28 ENCOUNTER — Other Ambulatory Visit: Payer: Self-pay

## 2020-04-03 ENCOUNTER — Ambulatory Visit: Payer: Medicare HMO | Admitting: Physical Therapy

## 2020-04-03 ENCOUNTER — Other Ambulatory Visit: Payer: Self-pay

## 2020-04-03 DIAGNOSIS — M25512 Pain in left shoulder: Secondary | ICD-10-CM | POA: Diagnosis not present

## 2020-04-03 DIAGNOSIS — M25551 Pain in right hip: Secondary | ICD-10-CM

## 2020-04-04 ENCOUNTER — Encounter: Payer: Self-pay | Admitting: Physical Therapy

## 2020-04-04 NOTE — Therapy (Signed)
[facility] [address] [place], [place], [address] Phone: [phone]   Fax:  [phone]  Physical Therapy Treatment  Patient Details  Name: Melissa [name] MRN: [id number] Date of Birth: [birth year] Referring Provider (PT): Lynne Leader   Encounter Date: 04/03/2020  PT End of Session - 04/04/20 1146    Visit Number  4    Number of Visits  12    Date for PT Re-Evaluation  05/01/20    Authorization Type  A[employer]na Medicare    PT Start Time  0930    PT Stop Time  1013    PT Time Calculation (min)  43 min    Activity Tolerance  Patient tolerated treatment well    Behavior During Therapy  [facility] for tasks assessed/performed       History reviewed. No pertinent past medical history.  History reviewed. No pertinent surgical history.  There were no vitals filed for this visit.  Subjective Assessment - 04/04/20 1145    Subjective  Pt ran 1/2 marathon on Sunday, reports no pain during activity. Has had pain last few days when walking and with activiteis at home, catches/pain in hip and in shoulder with ROM, reaching.    Currently in Pain?  Yes    Pain Score  2     Pain Location  Shoulder    Pain Orientation  Left    Pain Descriptors / Indicators  Aching    Pain Type  Acute pain    Pain Onset  More than a month ago    Pain Frequency  Intermittent    Pain Score  3    Pain Location  Hip    Pain Orientation  Right    Pain Descriptors / Indicators  Aching    Pain Type  Acute pain    Pain Onset  More than a month ago    Pain Frequency  Intermittent                       [facility] Adult PT Treatment/Exercise - 04/03/20 1218      Ambulation/Gait   Gait Comments  --      Exercises   Exercises  Shoulder;Knee/Hip      Knee/Hip Exercises: Stretches   Piriformis Stretch  2 reps;30 seconds    Piriformis Stretch Limitations  seated    Other Knee/Hip Stretches  Hip flexor kneeling stretch 30 sec x 3 bil;       Knee/Hip  Exercises: Aerobic   Stationary Bike  L 3 x 7 min       Knee/Hip Exercises: Standing   Hip Abduction  2 sets;10 reps    Abduction Limitations  YTB    Hip Extension  2 sets;10 reps    Extension Limitations  YTB    Other Standing Knee Exercises  Squat walks w GTB at thighs 25 ft x 4;     Other Standing Knee Exercises  SLS with hip flex and ER at wall 2 x 5 bil;       Knee/Hip Exercises: Sidelying   Hip ABduction  15 reps;Both    Clams   x 15 both       Shoulder Exercises: Supine   Horizontal ABduction  15 reps;AROM    External Rotation  --    External Rotation Weight (lbs)  --    Flexion  AROM;15 reps    Flexion Limitations  --    Other Supine Exercises  --  Shoulder Exercises: Standing   External Rotation  --    Theraband Level (Shoulder External Rotation)  --    Internal Rotation  --    Theraband Level (Shoulder Internal Rotation)  --    Row  --    Theraband Level (Shoulder Row)  --      Shoulder Exercises: Pulleys   Flexion  --      Manual Therapy   Manual Therapy  Joint mobilization;Passive ROM;Taping    Joint Mobilization  HIp Inf and post mobs,  long leg distraction for R hip pump x3 min;  L shoulder GHJ mobs inf and post,     Passive ROM  PROM for L shoulder , all motions.                PT Short Term Goals - 03/23/20 1251      PT SHORT TERM GOAL #1   Title  Pt to be independent with initial HEP    Time  2    Period  Weeks    Status  New    Target Date  04/03/20        PT Long Term Goals - 03/23/20 1253      PT LONG TERM GOAL #1   Title  Pt to be independent wtih final HEP    Time  6    Period  Weeks    Status  New    Target Date  05/02/20      PT LONG TERM GOAL #2   Title  Pt to report decreased pain in L shoulder and R hip,  to 0-2/10 with exercise and IADLS.    Time  6    Period  Weeks    Status  New    Target Date  05/02/20      PT LONG TERM GOAL #3   Title  Pt to demo full AROM of L shoulder without pain, to improve ability  for IADLs.    Time  6    Period  Weeks    Status  New    Target Date  05/02/20      PT LONG TERM GOAL #4   Title  Pt to demo improved strength and stability in R hip to be 5/5, and WNL for [facility]. , to improve ability for functional activities.    Time  6    Period  Weeks    Status  New    Target Date  05/02/20            Plan - 04/04/20 1147    Clinical Impression Statement  Focus on hip strengthening and stabilization today, ther ex progressed, minimal pain wiht activity. continued manual and hip mobilization for pain. Plan to continue strength and pain relief for shoulder and hip.    Examination-Activity Limitations  Reach Overhead;Squat;Stairs;Lift    Examination-Participation Restrictions  Cleaning;Driving;Shop;Yard Work    Stability/Clinical Decision Making  Stable/Uncomplicated    Rehab Potential  Good    PT Frequency  2x / week    PT Duration  6 weeks    PT Treatment/Interventions  ADLs/Self Care Home Management;Cryotherapy;Electrical Stimulation;Gait training;DME Instruction;Ultrasound;Traction;Moist Heat;Iontophoresis 4mg /ml Dexamethasone;Stair training;Functional mobility training;Therapeutic activities;Therapeutic exercise;Balance training;Neuromuscular re-education;Manual techniques;Patient/family education;Orthotic Fit/Training;Passive range of motion;Dry needling;Taping;Vasopneumatic Device;Spinal Manipulations;Joint Manipulations    Consulted and Agree with Plan of Care  Patient       Patient will benefit from skilled therapeutic intervention in order to improve the following deficits and impairments:  Decreased range of motion, Decreased  activity tolerance, Pain, Impaired flexibility, Decreased strength, Decreased mobility  Visit Diagnosis: Acute pain of left shoulder  Pain in right hip     Problem List There are no problems to display for this patient.  Lyndee Hensen, PT, DPT 12:04 PM  04/04/20    [facility] [place], [place], [address] Phone: [phone]   Fax:  [phone]  Name: Melissa [name] MRN: [id number] Date of Birth: [birth year]

## 2020-04-08 ENCOUNTER — Encounter: Payer: Self-pay | Admitting: Physical Therapy

## 2020-04-08 ENCOUNTER — Ambulatory Visit (INDEPENDENT_AMBULATORY_CARE_PROVIDER_SITE_OTHER): Payer: Medicare HMO | Admitting: Physical Therapy

## 2020-04-08 ENCOUNTER — Other Ambulatory Visit: Payer: Self-pay

## 2020-04-08 DIAGNOSIS — M25551 Pain in right hip: Secondary | ICD-10-CM | POA: Diagnosis not present

## 2020-04-08 DIAGNOSIS — M25512 Pain in left shoulder: Secondary | ICD-10-CM

## 2020-04-09 NOTE — Therapy (Signed)
[facility] [address] [place], [place], [address] Phone: [phone]   Fax:  [phone]  Physical Therapy Treatment  Patient Details  Name: Melissa [name] MRN: [id number] Date of Birth: [birth year] Referring Provider (PT): Lynne Leader   Encounter Date: 04/08/2020  PT End of Session - 04/09/20 1630    Visit Number  5    Number of Visits  12    Date for PT Re-Evaluation  05/01/20    Authorization Type  Aetna Medicare    PT Start Time  [phone]    PT Stop Time  0935    PT Time Calculation (min)  43 min    Activity Tolerance  Patient tolerated treatment well    Behavior During Therapy  [facility] for tasks assessed/performed       History reviewed. No pertinent past medical history.  History reviewed. No pertinent surgical history.  There were no vitals filed for this visit.  Subjective Assessment - 04/08/20 0858    Subjective  Pt states mild soreness in shoulder with ADLS and IADLs. Hip only sore with certain movements, changing directions.    Currently in Pain?  Yes    Pain Score  2     Pain Location  Shoulder    Pain Orientation  Left    Pain Descriptors / Indicators  Aching    Pain Type  Acute pain    Pain Onset  More than a month ago    Pain Frequency  Intermittent    Pain Score  3    Pain Location  Hip    Pain Orientation  Right    Pain Descriptors / Indicators  Aching;Shooting    Pain Type  Acute pain    Pain Onset  More than a month ago    Pain Frequency  Intermittent                       [facility] Adult PT Treatment/Exercise - 04/08/20 0913      Exercises   Exercises  Shoulder;Knee/Hip      Knee/Hip Exercises: Stretches   Piriformis Stretch  2 reps;30 seconds    Piriformis Stretch Limitations  seated    Other Knee/Hip Stretches  --      Knee/Hip Exercises: Aerobic   Stationary Bike  L 3 x 7 min       Knee/Hip Exercises: Standing   Hip Abduction  --    Abduction Limitations  --    Hip  Extension  --    Extension Limitations  --    Other Standing Knee Exercises  --    Other Standing Knee Exercises  --      Knee/Hip Exercises: Sidelying   Hip ABduction  --    Clams  --      Shoulder Exercises: Supine   Horizontal ABduction  15 reps;AROM    Horizontal ABduction Weight (lbs)  2    Flexion  AROM;15 reps      Shoulder Exercises: Standing   External Rotation  15 reps    Theraband Level (Shoulder External Rotation)  Level 2 (Red)    Internal Rotation  15 reps    Theraband Level (Shoulder Internal Rotation)  Level 2 (Red)    Flexion  15 reps;AROM    Row  20 reps    Theraband Level (Shoulder Row)  Level 3 (Green)      Shoulder Exercises: Music therapist Stretch  2 reps;30 seconds  Corner Stretch Limitations  at 90 deg      Manual Therapy   Manual Therapy  Joint mobilization;Passive ROM;Taping    Joint Mobilization  HIp Inf and post mobs,  long leg distraction for R hip pump x3 min;  L shoulder GHJ mobs inf and post,     Passive ROM  PROM for L shoulder , all motions.                PT Short Term Goals - 03/23/20 1251      PT SHORT TERM GOAL #1   Title  Pt to be independent with initial HEP    Time  2    Period  Weeks    Status  New    Target Date  04/03/20        PT Long Term Goals - 03/23/20 1253      PT LONG TERM GOAL #1   Title  Pt to be independent wtih final HEP    Time  6    Period  Weeks    Status  New    Target Date  05/02/20      PT LONG TERM GOAL #2   Title  Pt to report decreased pain in L shoulder and R hip,  to 0-2/10 with exercise and IADLS.    Time  6    Period  Weeks    Status  New    Target Date  05/02/20      PT LONG TERM GOAL #3   Title  Pt to demo full AROM of L shoulder without pain, to improve ability for IADLs.    Time  6    Period  Weeks    Status  New    Target Date  05/02/20      PT LONG TERM GOAL #4   Title  Pt to demo improved strength and stability in R hip to be 5/5, and WNL for [facility]. , to improve  ability for functional activities.    Time  6    Period  Weeks    Status  New    Target Date  05/02/20            Plan - 04/09/20 1632    Clinical Impression Statement  Pt with improving shoulder pain wtih ther ex, as well as improving ROM and stiffness. Pt continues to have pain in hip with full flexion ROM. Plan to progress as able.    Examination-Activity Limitations  Reach Overhead;Squat;Stairs;Lift    Examination-Participation Restrictions  Cleaning;Driving;Shop;Yard Work    Stability/Clinical Decision Making  Stable/Uncomplicated    Rehab Potential  Good    PT Frequency  2x / week    PT Duration  6 weeks    PT Treatment/Interventions  ADLs/Self Care Home Management;Cryotherapy;Electrical Stimulation;Gait training;DME Instruction;Ultrasound;Traction;Moist Heat;Iontophoresis 4mg /ml Dexamethasone;Stair training;Functional mobility training;Therapeutic activities;Therapeutic exercise;Balance training;Neuromuscular re-education;Manual techniques;Patient/family education;Orthotic Fit/Training;Passive range of motion;Dry needling;Taping;Vasopneumatic Device;Spinal Manipulations;Joint Manipulations    Consulted and Agree with Plan of Care  Patient       Patient will benefit from skilled therapeutic intervention in order to improve the following deficits and impairments:  Decreased range of motion, Decreased activity tolerance, Pain, Impaired flexibility, Decreased strength, Decreased mobility  Visit Diagnosis: Acute pain of left shoulder  Pain in right hip     Problem List There are no problems to display for this patient.  Lyndee Hensen, PT, DPT 4:33 PM  04/09/20    [facility]  [place], [place], [address] Phone: [phone]   Fax:  [phone]  Name: BURNESTINE SEIGFRIED MRN: [id number] Date of Birth: [birth year]

## 2020-04-10 ENCOUNTER — Other Ambulatory Visit: Payer: Self-pay

## 2020-04-10 ENCOUNTER — Ambulatory Visit (INDEPENDENT_AMBULATORY_CARE_PROVIDER_SITE_OTHER): Payer: Medicare HMO | Admitting: Physical Therapy

## 2020-04-10 ENCOUNTER — Encounter: Payer: Self-pay | Admitting: Physical Therapy

## 2020-04-10 DIAGNOSIS — M25551 Pain in right hip: Secondary | ICD-10-CM | POA: Diagnosis not present

## 2020-04-10 DIAGNOSIS — M25512 Pain in left shoulder: Secondary | ICD-10-CM | POA: Diagnosis not present

## 2020-04-10 NOTE — Therapy (Signed)
[facility] [address] [place], [place], [address] Phone: [phone]   Fax:  [phone]  Physical Therapy Treatment  Patient Details  Name: Melissa [name] MRN: [id number] Date of Birth: 11/28/1951 Referring Provider (PT): Lynne Leader   Encounter Date: 04/10/2020  PT End of Session - 04/10/20 1326    Visit Number  6    Number of Visits  12    Date for PT Re-Evaluation  05/01/20    Authorization Type  A[employer]na Medicare    PT Start Time  0930    PT Stop Time  [id number]    PT Time Calculation (min)  44 min    Activity Tolerance  Patient tolerated treatment well    Behavior During Therapy  [facility] for tasks assessed/performed       History reviewed. No pertinent past medical history.  History reviewed. No pertinent surgical history.  There were no vitals filed for this visit.  Subjective Assessment - 04/10/20 0928    Subjective  Pt states soreness yesterday, but better today.    Currently in Pain?  Yes    Pain Score  2     Pain Location  Shoulder    Pain Orientation  Left    Pain Descriptors / Indicators  Aching    Pain Score  2    Pain Location  Hip    Pain Descriptors / Indicators  Aching    Pain Type  Acute pain                        [facility] Adult PT Treatment/Exercise - 04/10/20 0933      Exercises   Exercises  Shoulder;Knee/Hip      Knee/Hip Exercises: Stretches   Piriformis Stretch  --    Piriformis Stretch Limitations  --    Other Knee/Hip Stretches  Hip flexor kneeling stretch 30 sec x 3 bil;       Knee/Hip Exercises: Aerobic   Stationary Bike  L 3 x 8 min       Knee/Hip Exercises: Standing   Hip Abduction  2 sets;10 reps    Abduction Limitations  YTB    Hip Extension  2 sets;10 reps    Extension Limitations  YTB      Knee/Hip Exercises: Supine   Bridges  20 reps    Other Supine Knee/Hip Exercises  CLams GTB x20;       Shoulder Exercises: Supine   Horizontal ABduction  15 reps;AROM    Horizontal ABduction Weight (lbs)  2    External Rotation  20 reps    External Rotation Weight (lbs)  3    Flexion  AROM;15 reps      Shoulder Exercises: Standing   External Rotation  15 reps    Theraband Level (Shoulder External Rotation)  Level 2 (Red)    Internal Rotation  15 reps    Theraband Level (Shoulder Internal Rotation)  Level 2 (Red)    Flexion  15 reps;AROM    Row  20 reps    Theraband Level (Shoulder Row)  Level 3 (Green)    Other Standing Exercises  Wall push ups x20;       Shoulder Exercises: Stretch   Corner Stretch  --    Corner Stretch Limitations  --      Manual Therapy   Manual Therapy  Joint mobilization;Passive ROM;Taping    Joint Mobilization  HIp Inf and post mobs,  long leg distraction  for R hip pump x3 min;  L shoulder GHJ mobs inf and post,     Passive ROM  PROM for L shoulder , all motions.                PT Short Term Goals - 03/23/20 1251      PT SHORT TERM GOAL #1   Title  Pt to be independent with initial HEP    Time  2    Period  Weeks    Status  New    Target Date  04/03/20        PT Long Term Goals - 03/23/20 1253      PT LONG TERM GOAL #1   Title  Pt to be independent wtih final HEP    Time  6    Period  Weeks    Status  New    Target Date  05/02/20      PT LONG TERM GOAL #2   Title  Pt to report decreased pain in L shoulder and R hip,  to 0-2/10 with exercise and IADLS.    Time  6    Period  Weeks    Status  New    Target Date  05/02/20      PT LONG TERM GOAL #3   Title  Pt to demo full AROM of L shoulder without pain, to improve ability for IADLs.    Time  6    Period  Weeks    Status  New    Target Date  05/02/20      PT LONG TERM GOAL #4   Title  Pt to demo improved strength and stability in R hip to be 5/5, and WNL for [facility]. , to improve ability for functional activities.    Time  6    Period  Weeks    Status  New    Target Date  05/02/20            Plan - 04/10/20 1327    Clinical Impression  Statement  Pt with improving shoulder pain, and improving ability for ther ex and strenthening. Has mild pain with full ER at end range. Pt with minimal hip pain during sessions or with strengthening, but has pain with rotation and walking at home. Plan to progress as able.    Examination-Activity Limitations  Reach Overhead;Squat;Stairs;Lift    Examination-Participation Restrictions  Cleaning;Driving;Shop;Yard Work    Stability/Clinical Decision Making  Stable/Uncomplicated    Rehab Potential  Good    PT Frequency  2x / week    PT Duration  6 weeks    PT Treatment/Interventions  ADLs/Self Care Home Management;Cryotherapy;Electrical Stimulation;Gait training;DME Instruction;Ultrasound;Traction;Moist Heat;Iontophoresis 4mg /ml Dexamethasone;Stair training;Functional mobility training;Therapeutic activities;Therapeutic exercise;Balance training;Neuromuscular re-education;Manual techniques;Patient/family education;Orthotic Fit/Training;Passive range of motion;Dry needling;Taping;Vasopneumatic Device;Spinal Manipulations;Joint Manipulations    Consulted and Agree with Plan of Care  Patient       Patient will benefit from skilled therapeutic intervention in order to improve the following deficits and impairments:  Decreased range of motion, Decreased activity tolerance, Pain, Impaired flexibility, Decreased strength, Decreased mobility  Visit Diagnosis: Acute pain of left shoulder  Pain in right hip     Problem List There are no problems to display for this patient.   Lyndee Hensen, PT, DPT 1:30 PM  04/10/20    [place], [place], [address] Phone: [phone]   Fax:  [phone]  Name: GWENDOLYN REBACK MRN: [id number] Date of Birth:  08/17/1951   

## 2020-04-22 ENCOUNTER — Ambulatory Visit: Payer: Medicare HMO | Admitting: Physical Therapy

## 2020-04-22 ENCOUNTER — Other Ambulatory Visit: Payer: Self-pay

## 2020-04-22 ENCOUNTER — Encounter: Payer: Self-pay | Admitting: Physical Therapy

## 2020-04-22 DIAGNOSIS — M25551 Pain in right hip: Secondary | ICD-10-CM

## 2020-04-22 DIAGNOSIS — M25512 Pain in left shoulder: Secondary | ICD-10-CM

## 2020-04-22 NOTE — Therapy (Signed)
[facility] [address] [place], [place], [address] Phone: [phone]   Fax:  [phone]  Physical Therapy Treatment  Patient Details  Name: Melissa [name] MRN: [id number] Date of Birth: 1950/12/10 Referring Provider (PT): Lynne Leader   Encounter Date: 04/22/2020  PT End of Session - 04/22/20 1529    Visit Number  7    Number of Visits  12    Date for PT Re-Evaluation  05/01/20    Authorization Type  Aetna Medicare    PT Start Time  1350    PT Stop Time  1430    PT Time Calculation (min)  40 min    Activity Tolerance  Patient tolerated treatment well    Behavior During Therapy  [facility] for tasks assessed/performed       History reviewed. No pertinent past medical history.  History reviewed. No pertinent surgical history.  There were no vitals filed for this visit.  Subjective Assessment - 04/22/20 1357    Subjective  Pt states hip is feeling good. Has been doing HEP, and has been able to exericise and run with less pain. L shoulder continues to be painful with overhead and ER motions.    Pain Score  2     Pain Location  Shoulder    Pain Orientation  Left    Pain Descriptors / Indicators  Aching    Pain Type  Acute pain    Pain Onset  More than a month ago    Pain Frequency  Intermittent    Pain Score  0    Pain Location  Hip    Pain Orientation  Right                        [facility] Adult PT Treatment/Exercise - 04/22/20 0001      Exercises   Exercises  Shoulder;Knee/Hip      Knee/Hip Exercises: Stretches   Other Knee/Hip Stretches  --      Knee/Hip Exercises: Aerobic   Stationary Bike  --      Knee/Hip Exercises: Standing   Hip Abduction  --    Abduction Limitations  --    Hip Extension  --    Extension Limitations  --      Knee/Hip Exercises: Supine   Bridges  --    Other Supine Knee/Hip Exercises  --      Shoulder Exercises: Supine   Horizontal ABduction  15 reps;AROM    Horizontal  ABduction Weight (lbs)  2    External Rotation  20 reps    External Rotation Weight (lbs)  2    Flexion  --      Shoulder Exercises: Prone   Other Prone Exercises  High plank with education on proper mechanics 45 sec x 2;       Shoulder Exercises: Sidelying   External Rotation  15 reps    External Rotation Weight (lbs)  2      Shoulder Exercises: Standing   External Rotation  15 reps    Theraband Level (Shoulder External Rotation)  Level 2 (Red)    Internal Rotation  15 reps    Theraband Level (Shoulder Internal Rotation)  Level 2 (Red)    Flexion  15 reps;AROM    ABduction  15 reps;AROM    Row  20 reps    Theraband Level (Shoulder Row)  Level 3 (Green)    Other Standing Exercises  Wall push ups x20;  Shoulder Exercises: ROM/Strengthening   UBE (Upper Arm Bike)  L1 x4 min;       Manual Therapy   Manual Therapy  Joint mobilization;Passive ROM;Taping    Joint Mobilization  HIp Inf and post mobs,  long leg distraction for R hip pump x3 min;  L shoulder GHJ mobs inf and post,     Passive ROM  PROM for L shoulder , all motions.              PT Education - 04/22/20 1529    Education Details  HEP reviewed,  eliminated Cane flexion and ER due to pain    Person(s) Educated  Patient    Methods  Explanation;Demonstration;Tactile cues;Verbal cues;Handout    Comprehension  Verbalized understanding;Returned demonstration;Verbal cues required;Tactile cues required       PT Short Term Goals - 04/22/20 1530      PT SHORT TERM GOAL #1   Title  Pt to be independent with initial HEP    Time  2    Period  Weeks    Status  Achieved    Target Date  04/03/20        PT Long Term Goals - 04/22/20 1530      PT LONG TERM GOAL #1   Title  Pt to be independent wtih final HEP    Time  6    Period  Weeks    Status  Partially Met      PT LONG TERM GOAL #2   Title  Pt to report decreased pain in L shoulder and R hip,  to 0-2/10 with exercise and IADLS.    Time  6    Period   Weeks    Status  Partially Met      PT LONG TERM GOAL #3   Title  Pt to demo full AROM of L shoulder without pain, to improve ability for IADLs.    Time  6    Period  Weeks    Status  Achieved      PT LONG TERM GOAL #4   Title  Pt to demo improved strength and stability in R hip to be 5/5, and WNL for [facility]. , to improve ability for functional activities.    Time  6    Period  Weeks    Status  Partially Met            Plan - 04/22/20 1531    Clinical Impression Statement  Pt making good progress with R hip pain, has been able to perform most activities without pain, and doing well with HEP. Does have mild pain with full hip flexion ROM in groin. Pt with continued pain in L shoulder, mostly with end range/over presusre of flexion and ER, as well as reaching up and back at home. She has no pain with mid range ER, and no pain with resisted ER. She has full AROM, and can perform repated AROM and strengthening without pain. Updated HEP, eliminated cane exercises that were causing pain. Discussed altering some activities at home for shoulder pain. Pt doing well with HEP, she will benefit from continued care for shoulder, and will f/u with MD for continued pain.    Examination-Activity Limitations  Reach Overhead;Squat;Stairs;Lift    Examination-Participation Restrictions  Cleaning;Driving;Shop;Yard Work    Stability/Clinical Decision Making  Stable/Uncomplicated    Rehab Potential  Good    PT Frequency  2x / week    PT Duration  6 weeks  PT Treatment/Interventions  ADLs/Self Care Home Management;Cryotherapy;Electrical Stimulation;Gait training;DME Instruction;Ultrasound;Traction;Moist Heat;Iontophoresis 25m/ml Dexamethasone;Stair training;Functional mobility training;Therapeutic activities;Therapeutic exercise;Balance training;Neuromuscular re-education;Manual techniques;Patient/family education;Orthotic Fit/Training;Passive range of motion;Dry needling;Taping;Vasopneumatic Device;Spinal  Manipulations;Joint Manipulations    Consulted and Agree with Plan of Care  Patient       Patient will benefit from skilled therapeutic intervention in order to improve the following deficits and impairments:  Decreased range of motion, Decreased activity tolerance, Pain, Impaired flexibility, Decreased strength, Decreased mobility  Visit Diagnosis: Acute pain of left shoulder  Pain in right hip     Problem List There are no problems to display for this patient.   LLyndee Hensen PT, DPT 3:35 PM  04/22/20    [place] [address]Phone: [phone]  Fax:  [phone] Name: Melissa CAMPOSANOMRN: [id number] of Birth: 122-May-1952

## 2020-04-22 NOTE — Patient Instructions (Signed)
Access Code: [email or web address] URL: [email or web address] Date: 03/22/2020 Prepared by: Lyndee Hensen  Exercises Tricep Push Up on Wall - 1 x daily - 2 sets - 10 reps Sidelying Shoulder ER with Towel and Dumbbell - 1 x daily - 2 sets - 10 reps Shoulder External Rotation with Anchored Resistance - 1 x daily - 2 sets - 10 reps Shoulder Internal Rotation with Resistance - 1 x daily - 2 sets - 10 reps Scapular Retraction with Resistance - 1 x daily - 2 sets - 10 reps Standing Shoulder Scaption - 1 x daily - 1-2 sets - 10 reps Supine Single Knee to Chest Stretch - 2 x daily - 3 reps - 30 hold Supine Figure 4 Piriformis Stretch - 2 x daily - 3 reps - 30 hold Half Kneeling Hip Flexor Stretch - 2 x daily - 3 reps - 30 hold Sidelying Hip Abduction - 1 x daily - 2 sets - 10 reps Clamshell - 1 x daily - 2 sets - 10 reps Standing Repeated Hip Abduction with Resistance - 1 x daily - 2 sets - 10 reps Standing Hip Extension with Anchored Resistance - 1 x daily - 2 sets - 10 reps Hooklying Isometric Clamshell - 1 x daily - 2 sets - 10 reps Supine Bridge - 1 x daily - 2 sets - 10 reps Single Leg Running Balance - 1 x daily - 1 sets - 10 reps Forward Reach - 1 x daily - 1 sets - 10 reps

## 2020-04-24 ENCOUNTER — Other Ambulatory Visit: Payer: Self-pay

## 2020-04-24 ENCOUNTER — Ambulatory Visit: Payer: Medicare HMO | Admitting: Physical Therapy

## 2020-04-24 ENCOUNTER — Encounter: Payer: Self-pay | Admitting: Physical Therapy

## 2020-04-24 DIAGNOSIS — M25512 Pain in left shoulder: Secondary | ICD-10-CM

## 2020-04-24 DIAGNOSIS — M25551 Pain in right hip: Secondary | ICD-10-CM | POA: Diagnosis not present

## 2020-04-24 NOTE — Therapy (Signed)
[facility] [address] [place], [place], [address] Phone: [phone]   Fax:  [phone]  Physical Therapy Treatment/Discharge   Patient Details  Name: Melissa [name] MRN: [id number] Date of Birth: 09-07-1951 Referring Provider (PT): Melissa [name]   Encounter Date: 04/24/2020  PT End of Session - 04/24/20 1436    Visit Number  8    Number of Visits  12    Date for PT Re-Evaluation  05/01/20    Authorization Type  Aetna Medicare    PT Start Time  9528    PT Stop Time  1426    PT Time Calculation (min)  41 min    Activity Tolerance  Patient tolerated treatment well    Behavior During Therapy  [facility] for tasks assessed/performed       History reviewed. No pertinent past medical history.  History reviewed. No pertinent surgical history.  There were no vitals filed for this visit.  Subjective Assessment - 04/24/20 1435    Subjective  Pt states no pain in R hip. L hip was sore yesterday when she was trying to go up stairs. L shoulder less painful today. pt has been diligent with HEP. Will call for MD f/u    Currently in Pain?  No/denies    Pain Score  0-No pain                        [facility] Adult PT Treatment/Exercise - 04/24/20 1350      Exercises   Exercises  Shoulder;Knee/Hip      Shoulder Exercises: Supine   Horizontal ABduction  --    Horizontal ABduction Weight (lbs)  --    External Rotation  --    External Rotation Weight (lbs)  --      Shoulder Exercises: Prone   Other Prone Exercises  --      Shoulder Exercises: Sidelying   External Rotation  20 reps    External Rotation Weight (lbs)  2      Shoulder Exercises: Standing   External Rotation  15 reps    Theraband Level (Shoulder External Rotation)  Level 2 (Red)    Internal Rotation  15 reps    Theraband Level (Shoulder Internal Rotation)  Level 2 (Red)    Flexion  15 reps;AROM    ABduction  15 reps;AROM    Row  20 reps    Theraband Level  (Shoulder Row)  Level 3 (Green)    Other Standing Exercises  Wall push ups x20;       Shoulder Exercises: ROM/Strengthening   UBE (Upper Arm Bike)  L1 x4 min;       Manual Therapy   Manual Therapy  Joint mobilization;Passive ROM;Taping    Joint Mobilization  bil HIp Inf mobs,  long leg distraction for bil  hip pump x3 min;  L shoulder GHJ mobs inf and post,     Passive ROM  PROM for L shoulder , all motions.              PT Education - 04/24/20 1435    Education Details  Final HEP reviewed    Person(s) Educated  Patient    Methods  Explanation;Demonstration    Comprehension  Verbalized understanding;Returned demonstration       PT Short Term Goals - 04/22/20 1530      PT SHORT TERM GOAL #1   Title  Pt to be independent with initial HEP  Time  2    Period  Weeks    Status  Achieved    Target Date  04/03/20        PT Long Term Goals - 04/24/20 1436      PT LONG TERM GOAL #1   Title  Pt to be independent wtih final HEP    Time  6    Period  Weeks    Status  Achieved      PT LONG TERM GOAL #2   Title  Pt to report decreased pain in L shoulder and R hip,  to 0-2/10 with exercise and IADLS.    Time  6    Period  Weeks    Status  Achieved      PT LONG TERM GOAL #3   Title  Pt to demo full AROM of L shoulder without pain, to improve ability for IADLs.    Time  6    Period  Weeks    Status  Achieved      PT LONG TERM GOAL #4   Title  Pt to demo improved strength and stability in R hip to be 5/5, and WNL for [facility] , to improve ability for functional activities.    Time  6    Period  Weeks    Status  Achieved            Plan - 04/24/20 1438    Clinical Impression Statement  Pt has made good progress. R hip much less painful, she has mild pain with full flexion ROM in groin, but no pain with recent activity and exercise. L shoulder improved today, but pain has been variable depending on activity . She has full ROM and strength, but has pain with excessive  and end range motions for ER and flexion. Discussed not over doing activity and ROM with L shoulder. Final HEP reviewed today, pt with good understanding. She hsa met goals at this time, ready for D/C to HEP, pt in agreement with plan. She will f/u with Sports med for pain in L shoulder.    Examination-Activity Limitations  Reach Overhead;Squat;Stairs;Lift    Examination-Participation Restrictions  Cleaning;Driving;Shop;Yard Work    Stability/Clinical Decision Making  Stable/Uncomplicated    Rehab Potential  Good    PT Frequency  2x / week    PT Duration  6 weeks    PT Treatment/Interventions  ADLs/Self Care Home Management;Cryotherapy;Electrical Stimulation;Gait training;DME Instruction;Ultrasound;Traction;Moist Heat;Iontophoresis 11m/ml Dexamethasone;Stair training;Functional mobility training;Therapeutic activities;Therapeutic exercise;Balance training;Neuromuscular re-education;Manual techniques;Patient/family education;Orthotic Fit/Training;Passive range of motion;Dry needling;Taping;Vasopneumatic Device;Spinal Manipulations;Joint Manipulations    Consulted and Agree with Plan of Care  Patient       Patient will benefit from skilled therapeutic intervention in order to improve the following deficits and impairments:  Decreased range of motion, Decreased activity tolerance, Pain, Impaired flexibility, Decreased strength, Decreased mobility  Visit Diagnosis: Acute pain of left shoulder  Pain in right hip     Problem List There are no problems to display for this patient.   LLyndee Hensen PT, DPT 2:40 PM  04/24/20    [facility] [place] [address]Phone: [phone]  Fax:  [phone] Name: Melissa POULTERMRN: [id number] of Birth: 120-Apr-1952  PHYSICAL THERAPY DISCHARGE SUMMARY  Visits from Start of Care: 8 Plan: Patient agrees to discharge.  Patient goals were met. Patient is being discharged  due to meeting the stated rehab goals.  ?????  Lyndee Hensen, PT, DPT 2:41 PM  04/24/20

## 2020-04-24 NOTE — Patient Instructions (Signed)
Access Code: [email or web address] URL: [email or web address] Date: 03/22/2020 Prepared by: Lyndee Hensen  Exercises Tricep Push Up on Wall - 1 x daily - 2 sets - 10 reps Sidelying Shoulder ER with Towel and Dumbbell - 1 x daily - 2 sets - 10 reps Shoulder External Rotation with Anchored Resistance - 1 x daily - 2 sets - 10 reps Shoulder Internal Rotation with Resistance - 1 x daily - 2 sets - 10 reps Scapular Retraction with Resistance - 1 x daily - 2 sets - 10 reps Standing Shoulder Scaption - 1 x daily - 1-2 sets - 10 reps Supine Single Knee to Chest Stretch - 2 x daily - 3 reps - 30 hold Supine Figure 4 Piriformis Stretch - 2 x daily - 3 reps - 30 hold Half Kneeling Hip Flexor Stretch - 2 x daily - 3 reps - 30 hold Sidelying Hip Abduction - 1 x daily - 2 sets - 10 reps Clamshell - 1 x daily - 2 sets - 10 reps Standing Repeated Hip Abduction with Resistance - 1 x daily - 2 sets - 10 reps Standing Hip Extension with Anchored Resistance - 1 x daily - 2 sets - 10 reps Hooklying Isometric Clamshell - 1 x daily - 2 sets - 10 reps Supine Bridge - 1 x daily - 2 sets - 10 reps Single Leg Running Balance - 1 x daily - 1 sets - 10 reps Forward Reach - 1 x daily - 1 sets - 10 reps

## 2020-05-01 DIAGNOSIS — H524 Presbyopia: Secondary | ICD-10-CM | POA: Diagnosis not present

## 2020-05-01 DIAGNOSIS — H04123 Dry eye syndrome of bilateral lacrimal glands: Secondary | ICD-10-CM | POA: Diagnosis not present

## 2020-05-01 DIAGNOSIS — H5203 Hypermetropia, bilateral: Secondary | ICD-10-CM | POA: Diagnosis not present

## 2020-05-01 DIAGNOSIS — H2512 Age-related nuclear cataract, left eye: Secondary | ICD-10-CM | POA: Diagnosis not present

## 2020-05-03 DIAGNOSIS — Z0100 Encounter for examination of eyes and vision without abnormal findings: Secondary | ICD-10-CM | POA: Diagnosis not present

## 2020-06-24 DIAGNOSIS — R69 Illness, unspecified: Secondary | ICD-10-CM | POA: Diagnosis not present

## 2020-09-06 DIAGNOSIS — Z23 Encounter for immunization: Secondary | ICD-10-CM | POA: Diagnosis not present

## 2020-09-10 ENCOUNTER — Ambulatory Visit: Payer: Medicare HMO | Attending: Internal Medicine

## 2020-09-10 DIAGNOSIS — Z23 Encounter for immunization: Secondary | ICD-10-CM

## 2020-09-10 NOTE — Progress Notes (Signed)
   Covid-19 Vaccination Clinic  Name:  TAMORA HUNEKE    MRN: [id number] DOB: October 17, 1951  09/10/2020  Ms. Spath was observed post Covid-19 immunization for 15 minutes without incident. She was provided with Vaccine Information Sheet and instruction to access the V-Safe system.   Ms. Boch was instructed to call 911 with any severe reactions post vaccine: Marland Kitchen Difficulty breathing  . Swelling of face and throat  . A fast heartbeat  . A bad rash all over body  . Dizziness and weakness

## 2021-02-26 DIAGNOSIS — Z0000 Encounter for general adult medical examination without abnormal findings: Secondary | ICD-10-CM | POA: Diagnosis not present

## 2021-02-26 DIAGNOSIS — D72819 Decreased white blood cell count, unspecified: Secondary | ICD-10-CM | POA: Diagnosis not present

## 2021-02-26 DIAGNOSIS — Z13220 Encounter for screening for lipoid disorders: Secondary | ICD-10-CM | POA: Diagnosis not present

## 2021-03-05 DIAGNOSIS — M8580 Other specified disorders of bone density and structure, unspecified site: Secondary | ICD-10-CM | POA: Diagnosis not present

## 2021-03-05 DIAGNOSIS — K580 Irritable bowel syndrome with diarrhea: Secondary | ICD-10-CM | POA: Diagnosis not present

## 2021-03-05 DIAGNOSIS — Z0000 Encounter for general adult medical examination without abnormal findings: Secondary | ICD-10-CM | POA: Diagnosis not present

## 2021-03-05 DIAGNOSIS — D72819 Decreased white blood cell count, unspecified: Secondary | ICD-10-CM | POA: Diagnosis not present

## 2021-05-02 DIAGNOSIS — H2513 Age-related nuclear cataract, bilateral: Secondary | ICD-10-CM | POA: Diagnosis not present

## 2021-05-02 DIAGNOSIS — H04123 Dry eye syndrome of bilateral lacrimal glands: Secondary | ICD-10-CM | POA: Diagnosis not present

## 2021-05-02 DIAGNOSIS — H524 Presbyopia: Secondary | ICD-10-CM | POA: Diagnosis not present

## 2021-05-02 DIAGNOSIS — H5203 Hypermetropia, bilateral: Secondary | ICD-10-CM | POA: Diagnosis not present

## 2021-09-12 DIAGNOSIS — Z23 Encounter for immunization: Secondary | ICD-10-CM | POA: Diagnosis not present

## 2022-02-24 DIAGNOSIS — Z86018 Personal history of other benign neoplasm: Secondary | ICD-10-CM | POA: Diagnosis not present

## 2022-02-24 DIAGNOSIS — L570 Actinic keratosis: Secondary | ICD-10-CM | POA: Diagnosis not present

## 2022-02-24 DIAGNOSIS — L578 Other skin changes due to chronic exposure to nonionizing radiation: Secondary | ICD-10-CM | POA: Diagnosis not present

## 2022-02-24 DIAGNOSIS — D2261 Melanocytic nevi of right upper limb, including shoulder: Secondary | ICD-10-CM | POA: Diagnosis not present

## 2022-02-24 DIAGNOSIS — Z85820 Personal history of malignant melanoma of skin: Secondary | ICD-10-CM | POA: Diagnosis not present

## 2022-02-24 DIAGNOSIS — L821 Other seborrheic keratosis: Secondary | ICD-10-CM | POA: Diagnosis not present

## 2022-02-24 DIAGNOSIS — D2272 Melanocytic nevi of left lower limb, including hip: Secondary | ICD-10-CM | POA: Diagnosis not present

## 2022-02-24 DIAGNOSIS — Z85828 Personal history of other malignant neoplasm of skin: Secondary | ICD-10-CM | POA: Diagnosis not present

## 2022-03-05 DIAGNOSIS — D72819 Decreased white blood cell count, unspecified: Secondary | ICD-10-CM | POA: Diagnosis not present

## 2022-03-05 DIAGNOSIS — M8580 Other specified disorders of bone density and structure, unspecified site: Secondary | ICD-10-CM | POA: Diagnosis not present

## 2022-03-05 DIAGNOSIS — Z0000 Encounter for general adult medical examination without abnormal findings: Secondary | ICD-10-CM | POA: Diagnosis not present

## 2022-03-10 DIAGNOSIS — G8929 Other chronic pain: Secondary | ICD-10-CM | POA: Diagnosis not present

## 2022-03-10 DIAGNOSIS — M8589 Other specified disorders of bone density and structure, multiple sites: Secondary | ICD-10-CM | POA: Diagnosis not present

## 2022-03-10 DIAGNOSIS — Z0000 Encounter for general adult medical examination without abnormal findings: Secondary | ICD-10-CM | POA: Diagnosis not present

## 2022-03-10 DIAGNOSIS — R0681 Apnea, not elsewhere classified: Secondary | ICD-10-CM | POA: Diagnosis not present

## 2022-03-10 DIAGNOSIS — M8580 Other specified disorders of bone density and structure, unspecified site: Secondary | ICD-10-CM | POA: Diagnosis not present

## 2022-03-10 DIAGNOSIS — M25571 Pain in right ankle and joints of right foot: Secondary | ICD-10-CM | POA: Diagnosis not present

## 2022-03-11 ENCOUNTER — Other Ambulatory Visit: Payer: Self-pay | Admitting: Internal Medicine

## 2022-03-11 DIAGNOSIS — Z0000 Encounter for general adult medical examination without abnormal findings: Secondary | ICD-10-CM

## 2022-03-11 DIAGNOSIS — Z1231 Encounter for screening mammogram for malignant neoplasm of breast: Secondary | ICD-10-CM

## 2022-03-12 ENCOUNTER — Ambulatory Visit: Payer: Medicare HMO | Admitting: Family Medicine

## 2022-03-12 ENCOUNTER — Ambulatory Visit: Payer: Self-pay

## 2022-03-12 ENCOUNTER — Ambulatory Visit (INDEPENDENT_AMBULATORY_CARE_PROVIDER_SITE_OTHER): Payer: Medicare HMO

## 2022-03-12 VITALS — BP 132/82 | HR 69 | Ht 64.0 in | Wt 141.1 lb

## 2022-03-12 DIAGNOSIS — M25571 Pain in right ankle and joints of right foot: Secondary | ICD-10-CM

## 2022-03-12 DIAGNOSIS — G8929 Other chronic pain: Secondary | ICD-10-CM

## 2022-03-12 DIAGNOSIS — Z880 Allergy status to penicillin: Secondary | ICD-10-CM | POA: Insufficient documentation

## 2022-03-12 DIAGNOSIS — K580 Irritable bowel syndrome with diarrhea: Secondary | ICD-10-CM | POA: Insufficient documentation

## 2022-03-12 DIAGNOSIS — Z8639 Personal history of other endocrine, nutritional and metabolic disease: Secondary | ICD-10-CM | POA: Insufficient documentation

## 2022-03-12 DIAGNOSIS — D72819 Decreased white blood cell count, unspecified: Secondary | ICD-10-CM | POA: Insufficient documentation

## 2022-03-12 DIAGNOSIS — M25562 Pain in left knee: Secondary | ICD-10-CM | POA: Diagnosis not present

## 2022-03-12 DIAGNOSIS — Z85820 Personal history of malignant melanoma of skin: Secondary | ICD-10-CM | POA: Insufficient documentation

## 2022-03-12 DIAGNOSIS — M8580 Other specified disorders of bone density and structure, unspecified site: Secondary | ICD-10-CM | POA: Insufficient documentation

## 2022-03-12 DIAGNOSIS — Z85828 Personal history of other malignant neoplasm of skin: Secondary | ICD-10-CM | POA: Insufficient documentation

## 2022-03-12 DIAGNOSIS — Z803 Family history of malignant neoplasm of breast: Secondary | ICD-10-CM | POA: Insufficient documentation

## 2022-03-12 NOTE — Patient Instructions (Addendum)
Thank you for coming in today.  ? ?Please get an Xray today before you leave  ? ?I recommend you obtained a compression sleeve to help with your joint problems. There are many options on the market however I recommend obtaining a full ankle Body Helix compression sleeve.  You can find information (including how to appropriate measure yourself for sizing) can be found at www.Body http://www.lambert.com/.  Many of these products are health savings account (HSA) eligible.  ?You can use the compression sleeve at any time throughout the day but is most important to use while being active as well as for 2 hours post-activity.   It is appropriate to ice following activity with the compression sleeve in place.  ? ?I've referred you to Physical Therapy.  Let us know if you don't hear from them in one week.  ? ?Recheck back in 6 weeks ? ? ?

## 2022-03-12 NOTE — Progress Notes (Signed)
? ?I, Wendy Poet, LAT, ATC, am serving as scribe for Dr. Lynne Leader. ? ?Melissa [name] is a 71 y.o. female who presents to [place] [facility] today for R ankle pain.  She was last seen by Dr. Georgina Snell on 03/08/20 for L shoulder pain.  Today, pt reports R ankle pain x almost a year. Pt had been running a trail marathon and trail 50K and the R ankle started hurting. She locates her pain to around Achilles. Pt notes if she steps on her ankle wrong, she will get a shooting pain in the posterior aspect of the R ankle ? ?R ankle swelling: no ?Aggravating factors: ankle INV ?Treatments tried: pressure, stretches, massage, ? ?Pt also c/o L knee pain ongoing for 2-3 months. Pt locates pain to the anterior-superior aspect of the L knee. ? ?L knee swelling: yes ?Mechanical symptoms: no ?Treatments tried: ice, elevation ? ?Pertinent review of systems: No fevers or chills ? ?Relevant historical information: Osteopenia and history of melanoma. ? ? ?Exam:  ?BP 132/82   Pulse 69   Ht '5\' 4"'$  (1.626 m)   Wt 141 lb 1.6 oz (64 kg)   SpO2 96%   BMI 24.22 kg/m?  ?General: Well Developed, well nourished, and in no acute distress.  ? ?MSK: Right ankle normal-appearing ?Normal motion. ?Nontender. ?Normal foot and ankle motion. ?Intact strength. ?Some pain with resisted foot dorsiflexion. ? ? ?Left knee: Normal-appearing ?Normal motion. ?Mildly tender to palpation at the superior pole of the patella at the quad tendon insertion.Marland Kitchen ?Intact strength. ?Stable ligamentous exam. ? ? ? ?Lab and Radiology Results ? ?Diagnostic Limited MSK Ultrasound of: Right ankle  ?Achilles tendon intact. ?At the insertion of the Achilles tendon onto the calcaneus is calcification within the mid substance of the tendon consistent with chronic insertional calcific tendinopathy. ?Peroneal tendon normal-appearing ?Lateral ankle normal-appearing ?Plantar fascia trace calcific change at the very insertion/origin at the very posterior  aspect of the plantar calcaneus. ?Impression: Insertional calcific Achilles tendinitis ? ?Diagnostic Limited MSK Ultrasound of: Left knee ?Quad tendon intact. ?Calcific change at the quad tendon insertion onto the superior pole of the patella consistent with calcific tendinopathy. ?Patellar tendon normal. ?Lateral joint line normal. ?Medial joint line normal-appearing ?Impression: Calcific quadriceps tendinitis ? ? ?X-ray images right ankle and left knee obtained today personally and independently interpreted ? ?Right ankle: No acute fractures. ?Trace enthesiopathy changes at the calcaneus insertion of the Achilles tendon. ? ?Left knee: Minimal DJD.  No acute fractures. ? ?Await formal radiology review ? ?Assessment and Plan: ?71 y.o. female with right ankle pain primarily thought to be due to Achilles tendinitis and very proximal planter fasciitis.  She should be a good candidate for rehab for this along with a compressive ankle sleeve with body helix.  If not improving with trial of PT would recommend MRI to further characterize source of pain that she may have some instability that is contributing to her pain as well. ? ?Left knee pain due to quadriceps tendinitis at the insertion of the quad tendon on the patella.  Again PT. ? ?Recheck in 6 weeks. ? ? ? ? ?PDMP not reviewed this encounter. ?Orders Placed This Encounter  ?Procedures  ? [place] LIMITED JOINT SPACE STRUCTURES LOW RIGHT(NO LINKED CHARGES)  ?  Order Specific Question:   Reason for Exam (SYMPTOM  OR DIAGNOSIS REQUIRED)  ?  Answer:   R ankle pain  ?  Order Specific Question:   Preferred imaging location?  ?  Answer:   [place]  ? DG Ankle Complete Right  ?  Standing Status:   Future  ?  Number of Occurrences:   1  ?  Standing Expiration Date:   03/13/2023  ?  Order Specific Question:   Reason for Exam (SYMPTOM  OR DIAGNOSIS REQUIRED)  ?  Answer:   right ankle pain  ?  Order Specific Question:   Preferred imaging location?  ?   Answer:   [facility]  ? DG Knee AP/LAT W/[place] Left  ?  Standing Status:   Future  ?  Number of Occurrences:   1  ?  Standing Expiration Date:   04/11/2022  ?  Order Specific Question:   Reason for Exam (SYMPTOM  OR DIAGNOSIS REQUIRED)  ?  Answer:   left knee pain  ?  Order Specific Question:   Preferred imaging location?  ?  Answer:   [facility]  ? Ambulatory referral to Physical Therapy  ?  Referral Priority:   Routine  ?  Referral Type:   Physical Medicine  ?  Referral Reason:   Specialty Services Required  ?  Requested Specialty:   Physical Therapy  ?  Number of Visits Requested:   1  ? ?No orders of the defined types were placed in this encounter. ? ? ? ?Discussed warning signs or symptoms. Please see discharge instructions. Patient expresses understanding. ? ? ?The above documentation has been reviewed and is accurate and complete Lynne Leader, M.D. ? ? ?

## 2022-03-13 NOTE — Progress Notes (Signed)
X-ray images right ankle show mild arthritis changes

## 2022-03-13 NOTE — Progress Notes (Signed)
Left knee x-ray shows minimal arthritis changes

## 2022-03-20 ENCOUNTER — Ambulatory Visit
Admission: RE | Admit: 2022-03-20 | Discharge: 2022-03-20 | Disposition: A | Discharge status: Home / Self Care | Payer: Medicare HMO | Source: Ambulatory Visit | Attending: Internal Medicine | Admitting: Internal Medicine

## 2022-03-20 DIAGNOSIS — Z1231 Encounter for screening mammogram for malignant neoplasm of breast: Secondary | ICD-10-CM | POA: Diagnosis not present

## 2022-03-26 DIAGNOSIS — R0681 Apnea, not elsewhere classified: Secondary | ICD-10-CM | POA: Diagnosis not present

## 2022-03-26 DIAGNOSIS — R0683 Snoring: Secondary | ICD-10-CM | POA: Diagnosis not present

## 2022-03-26 DIAGNOSIS — G4719 Other hypersomnia: Secondary | ICD-10-CM | POA: Diagnosis not present

## 2022-03-26 DIAGNOSIS — G4733 Obstructive sleep apnea (adult) (pediatric): Secondary | ICD-10-CM | POA: Diagnosis not present

## 2022-04-07 ENCOUNTER — Ambulatory Visit
Admission: RE | Admit: 2022-04-07 | Discharge: 2022-04-07 | Disposition: A | Discharge status: Home / Self Care | Payer: No Typology Code available for payment source | Source: Ambulatory Visit | Attending: Internal Medicine | Admitting: Internal Medicine

## 2022-04-07 DIAGNOSIS — Z0000 Encounter for general adult medical examination without abnormal findings: Secondary | ICD-10-CM

## 2022-04-23 ENCOUNTER — Ambulatory Visit: Payer: Medicare HMO | Admitting: Family Medicine

## 2022-05-05 DIAGNOSIS — H52203 Unspecified astigmatism, bilateral: Secondary | ICD-10-CM | POA: Diagnosis not present

## 2022-05-05 DIAGNOSIS — H2513 Age-related nuclear cataract, bilateral: Secondary | ICD-10-CM | POA: Diagnosis not present

## 2022-05-05 DIAGNOSIS — H5203 Hypermetropia, bilateral: Secondary | ICD-10-CM | POA: Diagnosis not present

## 2022-05-05 DIAGNOSIS — H524 Presbyopia: Secondary | ICD-10-CM | POA: Diagnosis not present

## 2022-05-06 DIAGNOSIS — Z0100 Encounter for examination of eyes and vision without abnormal findings: Secondary | ICD-10-CM | POA: Diagnosis not present

## 2022-10-02 DIAGNOSIS — Z23 Encounter for immunization: Secondary | ICD-10-CM | POA: Diagnosis not present

## 2023-02-12 DIAGNOSIS — R69 Illness, unspecified: Secondary | ICD-10-CM | POA: Diagnosis not present

## 2023-02-26 DIAGNOSIS — D225 Melanocytic nevi of trunk: Secondary | ICD-10-CM | POA: Diagnosis not present

## 2023-02-26 DIAGNOSIS — Z85820 Personal history of malignant melanoma of skin: Secondary | ICD-10-CM | POA: Diagnosis not present

## 2023-02-26 DIAGNOSIS — D2261 Melanocytic nevi of right upper limb, including shoulder: Secondary | ICD-10-CM | POA: Diagnosis not present

## 2023-02-26 DIAGNOSIS — L578 Other skin changes due to chronic exposure to nonionizing radiation: Secondary | ICD-10-CM | POA: Diagnosis not present

## 2023-02-26 DIAGNOSIS — L570 Actinic keratosis: Secondary | ICD-10-CM | POA: Diagnosis not present

## 2023-02-26 DIAGNOSIS — L821 Other seborrheic keratosis: Secondary | ICD-10-CM | POA: Diagnosis not present

## 2023-02-26 DIAGNOSIS — Z86018 Personal history of other benign neoplasm: Secondary | ICD-10-CM | POA: Diagnosis not present

## 2023-02-26 DIAGNOSIS — Z85828 Personal history of other malignant neoplasm of skin: Secondary | ICD-10-CM | POA: Diagnosis not present

## 2023-02-26 DIAGNOSIS — D485 Neoplasm of uncertain behavior of skin: Secondary | ICD-10-CM | POA: Diagnosis not present

## 2023-02-26 DIAGNOSIS — D2272 Melanocytic nevi of left lower limb, including hip: Secondary | ICD-10-CM | POA: Diagnosis not present

## 2023-03-11 DIAGNOSIS — Z79899 Other long term (current) drug therapy: Secondary | ICD-10-CM | POA: Diagnosis not present

## 2023-03-11 DIAGNOSIS — Z0000 Encounter for general adult medical examination without abnormal findings: Secondary | ICD-10-CM | POA: Diagnosis not present

## 2023-03-16 DIAGNOSIS — Z23 Encounter for immunization: Secondary | ICD-10-CM | POA: Diagnosis not present

## 2023-03-16 DIAGNOSIS — Z0000 Encounter for general adult medical examination without abnormal findings: Secondary | ICD-10-CM | POA: Diagnosis not present

## 2023-03-16 DIAGNOSIS — M8580 Other specified disorders of bone density and structure, unspecified site: Secondary | ICD-10-CM | POA: Diagnosis not present

## 2023-05-14 DIAGNOSIS — H524 Presbyopia: Secondary | ICD-10-CM | POA: Diagnosis not present

## 2023-05-14 DIAGNOSIS — H2513 Age-related nuclear cataract, bilateral: Secondary | ICD-10-CM | POA: Diagnosis not present

## 2023-05-14 DIAGNOSIS — H04123 Dry eye syndrome of bilateral lacrimal glands: Secondary | ICD-10-CM | POA: Diagnosis not present

## 2023-05-14 DIAGNOSIS — H5203 Hypermetropia, bilateral: Secondary | ICD-10-CM | POA: Diagnosis not present

## 2023-10-09 DIAGNOSIS — R69 Illness, unspecified: Secondary | ICD-10-CM | POA: Diagnosis not present

## 2023-11-05 IMAGING — CT CT CARDIAC CORONARY ARTERY CALCIUM SCORE
3 series · 14 of 20 positions shown, 16 images · non-contrast
Comparison: None Available.

CLINICAL DATA: Family history

EXAM:
CT CARDIAC CORONARY ARTERY CALCIUM SCORE
TECHNIQUE: Non-contrast imaging through the heart was performed using
prospective ECG gating. Image post processing was performed on an
independent workstation, allowing for quantitative analysis of the
heart and coronary arteries. Note that this exam targets the heart
and the chest was not imaged in its entirety.

[Series 2: calcium scoring 2.00 qr36 bestdiast 69% hrt calciu · axial · 0.35mm/px · z∈[+1618,+1706]mm · 4 of 74 slices shown]
[im 15/74  vessel]
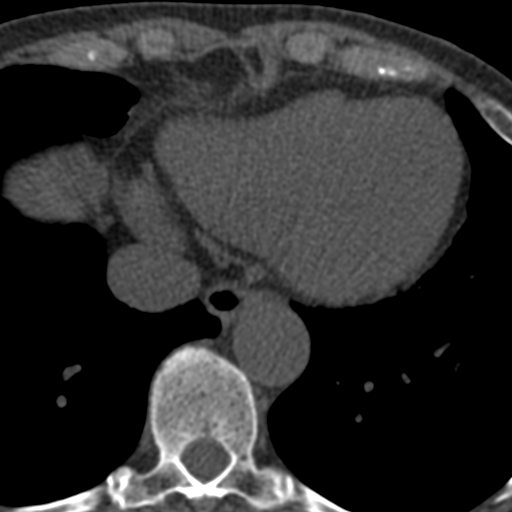
[im 30/74  vessel]
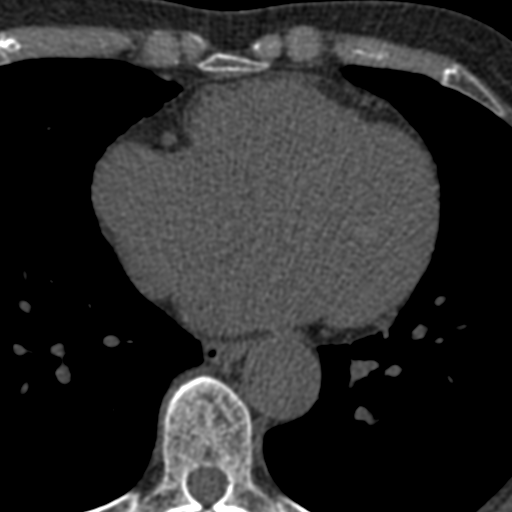
[im 44/74  vessel]
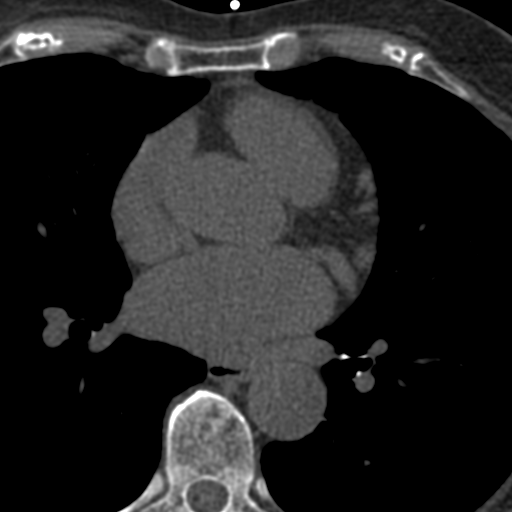
[im 59/74  vessel]
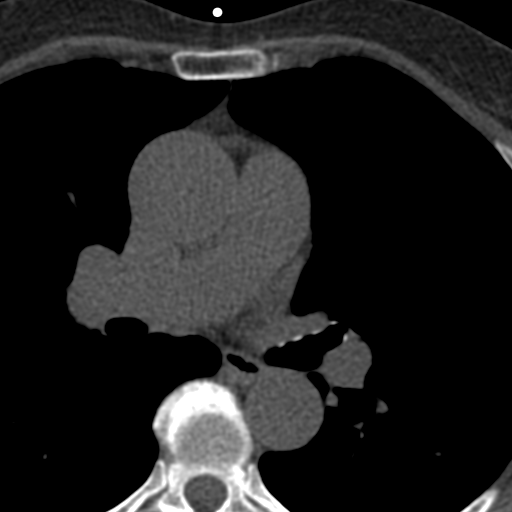

[Series 3: calcium scoring 2.00 br40 bestdiast 69% axial · axial · 0.51mm/px · z∈[+1614,+1710]mm · 5 of 74 slices shown, 7 images]
[im 13/74  vessel]
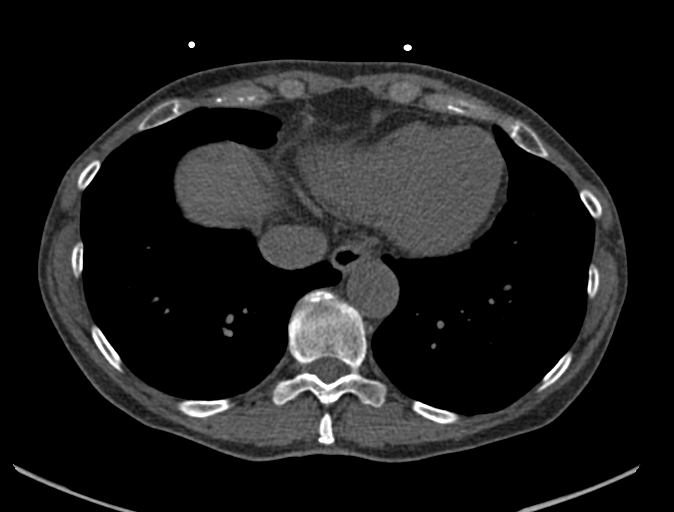
[im 13/74  lung]
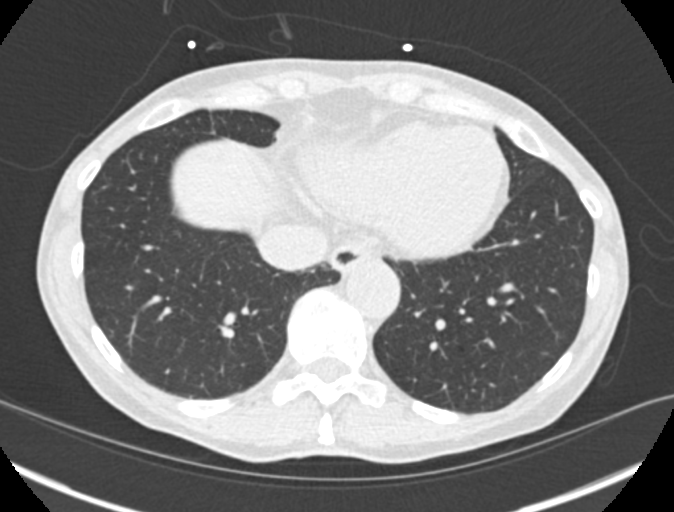
[im 25/74  vessel]
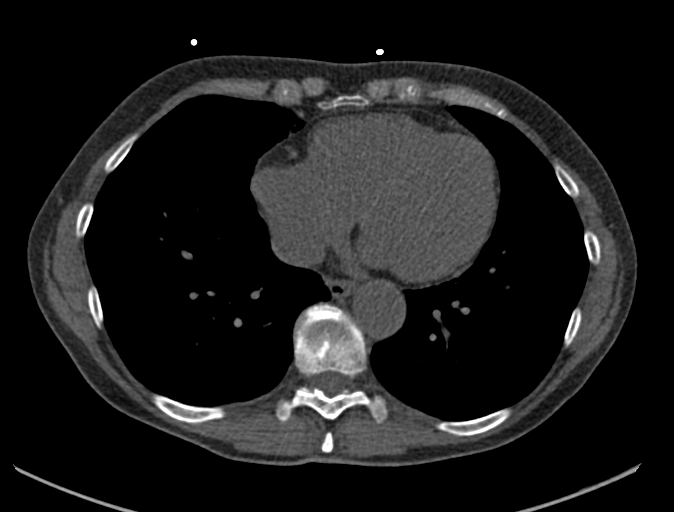
[im 37/74  vessel]
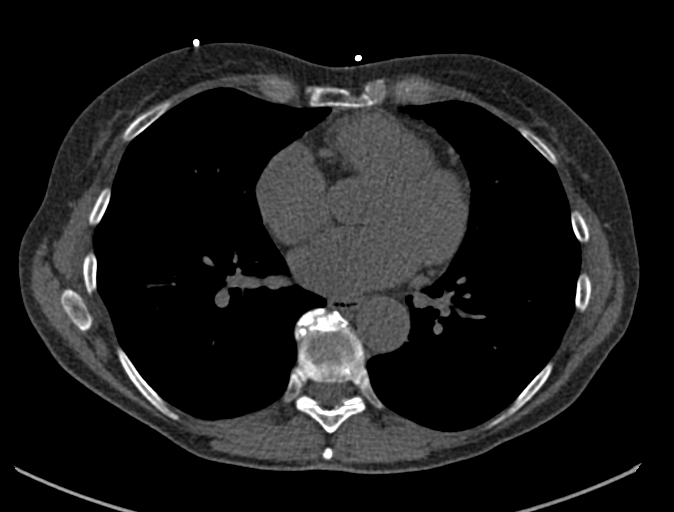
[im 49/74  vessel]
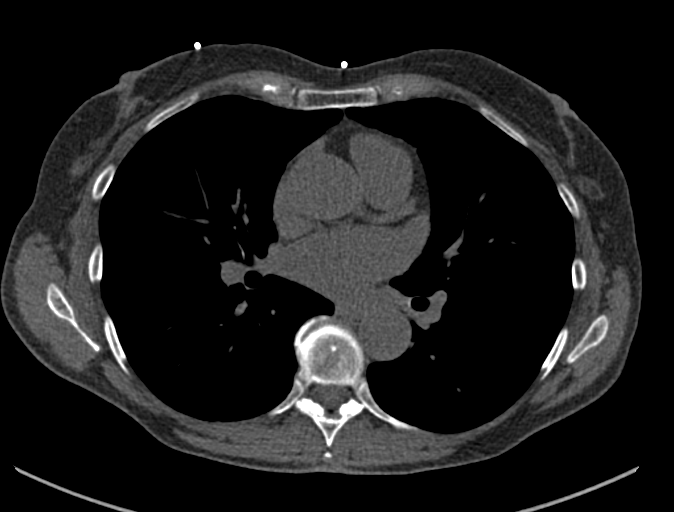
[im 61/74  vessel]
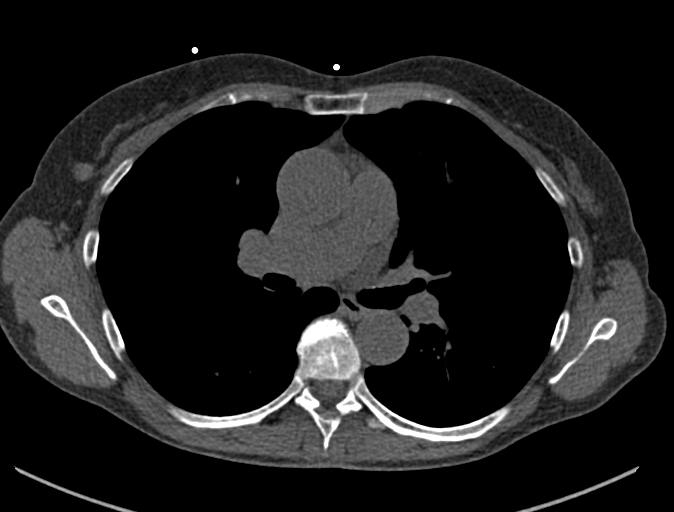
[im 61/74  lung]
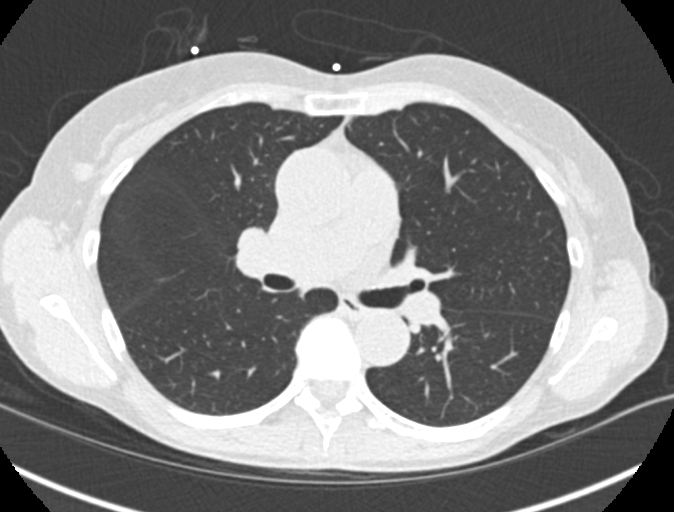

[Series 9: calcium scoring 2.00 br60 bestdiast 69% lungs · axial · 0.51mm/px · z∈[+1614,+1710]mm · 5 of 74 slices shown]
[im 13/74  vessel]
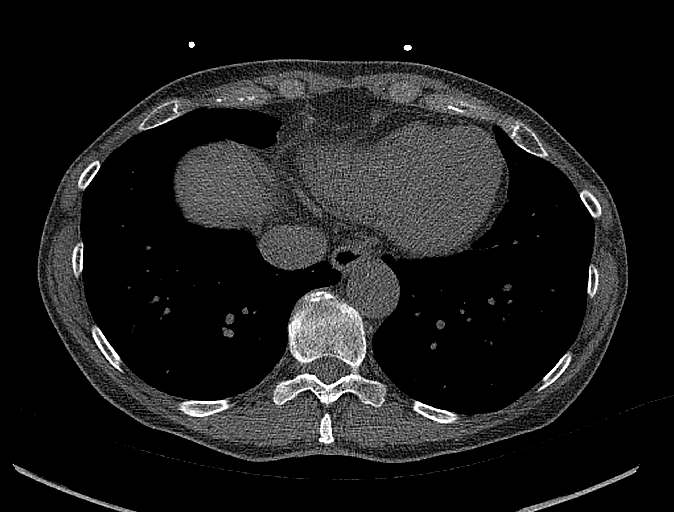
[im 25/74  vessel]
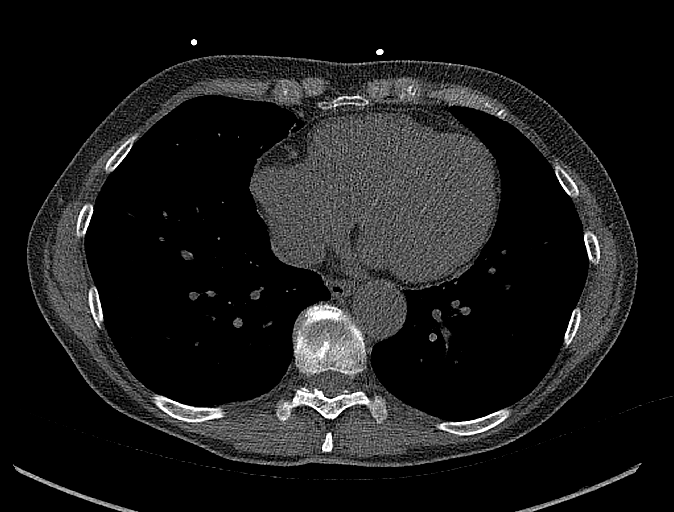
[im 37/74  vessel]
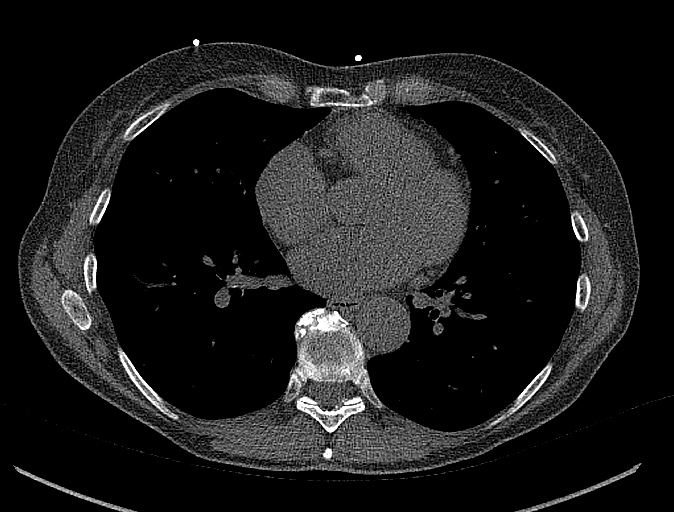
[im 49/74  vessel]
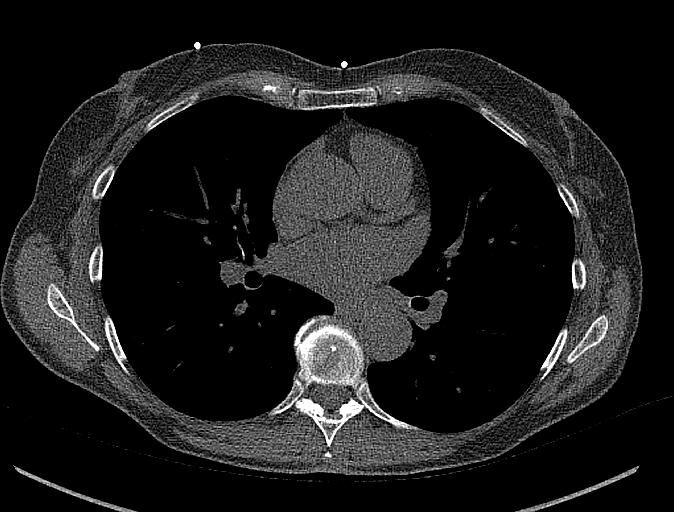
[im 61/74  vessel]
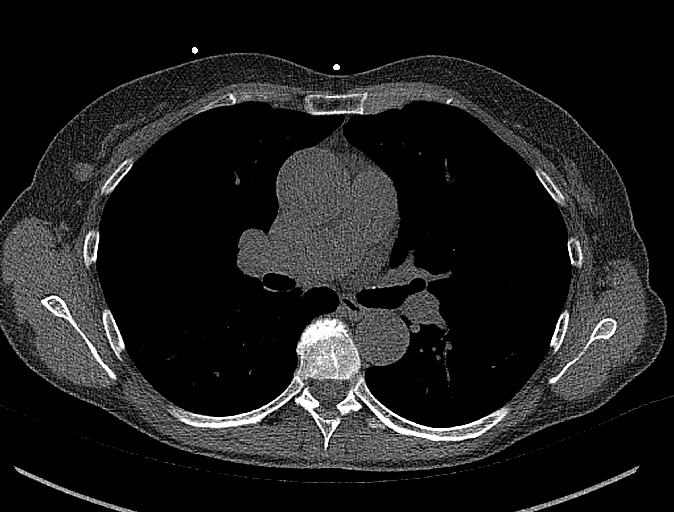

[14 of 20 positions shown; findings below may reference images not displayed]

FINDINGS: CORONARY CALCIUM SCORES:

Left Main: 0

LAD: 0

LCx: 0

RCA: 0

Total Agatston Score: 0

[HOSPITAL] percentile: 0

AORTA MEASUREMENTS:

Ascending Aorta: 36 mm

Descending Aorta: 26 mm

OTHER FINDINGS:

Heart is normal size. Aorta normal caliber. Punctate calcification
in the aortic root. No adenopathy. No confluent airspace opacities
or effusions. No acute findings in the upper abdomen. Chest wall
soft tissues are unremarkable. No acute bony abnormality.
IMPRESSION: No visible coronary artery calcifications. Total coronary calcium
score of 0.

Punctate calcification in the aortic root.

No acute extra cardiac abnormality.

## 2024-01-25 DIAGNOSIS — J342 Deviated nasal septum: Secondary | ICD-10-CM | POA: Diagnosis not present

## 2024-01-25 DIAGNOSIS — Z880 Allergy status to penicillin: Secondary | ICD-10-CM | POA: Diagnosis not present

## 2024-01-25 DIAGNOSIS — J0101 Acute recurrent maxillary sinusitis: Secondary | ICD-10-CM | POA: Diagnosis not present

## 2024-01-25 DIAGNOSIS — Z23 Encounter for immunization: Secondary | ICD-10-CM | POA: Diagnosis not present

## 2024-02-24 DIAGNOSIS — Z86018 Personal history of other benign neoplasm: Secondary | ICD-10-CM | POA: Diagnosis not present

## 2024-02-24 DIAGNOSIS — L578 Other skin changes due to chronic exposure to nonionizing radiation: Secondary | ICD-10-CM | POA: Diagnosis not present

## 2024-02-24 DIAGNOSIS — L720 Epidermal cyst: Secondary | ICD-10-CM | POA: Diagnosis not present

## 2024-02-24 DIAGNOSIS — Z85828 Personal history of other malignant neoplasm of skin: Secondary | ICD-10-CM | POA: Diagnosis not present

## 2024-02-24 DIAGNOSIS — D1801 Hemangioma of skin and subcutaneous tissue: Secondary | ICD-10-CM | POA: Diagnosis not present

## 2024-02-24 DIAGNOSIS — L821 Other seborrheic keratosis: Secondary | ICD-10-CM | POA: Diagnosis not present

## 2024-02-24 DIAGNOSIS — Z85820 Personal history of malignant melanoma of skin: Secondary | ICD-10-CM | POA: Diagnosis not present

## 2024-02-24 DIAGNOSIS — L820 Inflamed seborrheic keratosis: Secondary | ICD-10-CM | POA: Diagnosis not present

## 2024-03-08 ENCOUNTER — Other Ambulatory Visit: Payer: Self-pay | Admitting: Internal Medicine

## 2024-03-08 DIAGNOSIS — Z1231 Encounter for screening mammogram for malignant neoplasm of breast: Secondary | ICD-10-CM

## 2024-03-09 ENCOUNTER — Ambulatory Visit
Admission: RE | Admit: 2024-03-09 | Discharge: 2024-03-09 | Disposition: A | Discharge status: Home / Self Care | Source: Ambulatory Visit | Attending: Internal Medicine | Admitting: Internal Medicine

## 2024-03-09 DIAGNOSIS — Z1231 Encounter for screening mammogram for malignant neoplasm of breast: Secondary | ICD-10-CM

## 2024-03-14 ENCOUNTER — Other Ambulatory Visit: Payer: Self-pay | Admitting: Internal Medicine

## 2024-03-14 DIAGNOSIS — R928 Other abnormal and inconclusive findings on diagnostic imaging of breast: Secondary | ICD-10-CM

## 2024-03-20 DIAGNOSIS — Z85820 Personal history of malignant melanoma of skin: Secondary | ICD-10-CM | POA: Diagnosis not present

## 2024-03-20 DIAGNOSIS — Z0000 Encounter for general adult medical examination without abnormal findings: Secondary | ICD-10-CM | POA: Diagnosis not present

## 2024-03-20 DIAGNOSIS — M8580 Other specified disorders of bone density and structure, unspecified site: Secondary | ICD-10-CM | POA: Diagnosis not present

## 2024-03-20 DIAGNOSIS — E559 Vitamin D deficiency, unspecified: Secondary | ICD-10-CM | POA: Diagnosis not present

## 2024-03-20 DIAGNOSIS — M8589 Other specified disorders of bone density and structure, multiple sites: Secondary | ICD-10-CM | POA: Diagnosis not present

## 2024-03-27 ENCOUNTER — Ambulatory Visit
Admission: RE | Admit: 2024-03-27 | Discharge: 2024-03-27 | Disposition: A | Discharge status: Home / Self Care | Source: Ambulatory Visit | Attending: Internal Medicine | Admitting: Internal Medicine

## 2024-03-27 DIAGNOSIS — R928 Other abnormal and inconclusive findings on diagnostic imaging of breast: Secondary | ICD-10-CM

## 2024-03-27 DIAGNOSIS — N6002 Solitary cyst of left breast: Secondary | ICD-10-CM | POA: Diagnosis not present

## 2024-03-27 DIAGNOSIS — N6315 Unspecified lump in the right breast, overlapping quadrants: Secondary | ICD-10-CM | POA: Diagnosis not present

## 2024-03-27 DIAGNOSIS — N6001 Solitary cyst of right breast: Secondary | ICD-10-CM | POA: Diagnosis not present

## 2024-03-27 DIAGNOSIS — N6323 Unspecified lump in the left breast, lower outer quadrant: Secondary | ICD-10-CM | POA: Diagnosis not present

## 2024-05-16 DIAGNOSIS — H5203 Hypermetropia, bilateral: Secondary | ICD-10-CM | POA: Diagnosis not present

## 2024-05-16 DIAGNOSIS — H52203 Unspecified astigmatism, bilateral: Secondary | ICD-10-CM | POA: Diagnosis not present

## 2024-05-16 DIAGNOSIS — H524 Presbyopia: Secondary | ICD-10-CM | POA: Diagnosis not present

## 2024-05-16 DIAGNOSIS — H2513 Age-related nuclear cataract, bilateral: Secondary | ICD-10-CM | POA: Diagnosis not present

## 2024-05-16 DIAGNOSIS — H25012 Cortical age-related cataract, left eye: Secondary | ICD-10-CM | POA: Diagnosis not present

## 2024-05-16 DIAGNOSIS — H04123 Dry eye syndrome of bilateral lacrimal glands: Secondary | ICD-10-CM | POA: Diagnosis not present

## 2024-05-23 DIAGNOSIS — E559 Vitamin D deficiency, unspecified: Secondary | ICD-10-CM | POA: Diagnosis not present

## 2024-07-27 DIAGNOSIS — Z23 Encounter for immunization: Secondary | ICD-10-CM | POA: Diagnosis not present

## 2024-08-30 DIAGNOSIS — Z1211 Encounter for screening for malignant neoplasm of colon: Secondary | ICD-10-CM | POA: Diagnosis not present

## 2024-08-30 DIAGNOSIS — Z1212 Encounter for screening for malignant neoplasm of rectum: Secondary | ICD-10-CM | POA: Diagnosis not present

## 2024-09-04 DIAGNOSIS — M7661 Achilles tendinitis, right leg: Secondary | ICD-10-CM | POA: Diagnosis not present

## 2024-09-05 LAB — COLOGUARD: COLOGUARD: NEGATIVE

## 2024-09-21 DIAGNOSIS — M7661 Achilles tendinitis, right leg: Secondary | ICD-10-CM | POA: Diagnosis not present

## 2024-09-28 DIAGNOSIS — M7661 Achilles tendinitis, right leg: Secondary | ICD-10-CM | POA: Diagnosis not present

## 2024-10-23 DIAGNOSIS — M6701 Short Achilles tendon (acquired), right ankle: Secondary | ICD-10-CM | POA: Diagnosis not present

## 2024-10-23 DIAGNOSIS — M7661 Achilles tendinitis, right leg: Secondary | ICD-10-CM | POA: Diagnosis not present

## 2024-10-24 DIAGNOSIS — M7661 Achilles tendinitis, right leg: Secondary | ICD-10-CM | POA: Diagnosis not present

## 2024-11-08 DIAGNOSIS — M7661 Achilles tendinitis, right leg: Secondary | ICD-10-CM | POA: Diagnosis not present

## 2024-11-10 DIAGNOSIS — M25571 Pain in right ankle and joints of right foot: Secondary | ICD-10-CM | POA: Diagnosis not present

## 2024-11-20 DIAGNOSIS — M6701 Short Achilles tendon (acquired), right ankle: Secondary | ICD-10-CM | POA: Diagnosis not present

## 2024-11-20 DIAGNOSIS — M722 Plantar fascial fibromatosis: Secondary | ICD-10-CM | POA: Diagnosis not present

## 2024-11-20 DIAGNOSIS — M7661 Achilles tendinitis, right leg: Secondary | ICD-10-CM | POA: Diagnosis not present

## 2024-12-21 ENCOUNTER — Other Ambulatory Visit: Payer: Self-pay | Admitting: Internal Medicine

## 2024-12-21 DIAGNOSIS — R519 Headache, unspecified: Secondary | ICD-10-CM

## 2024-12-21 DIAGNOSIS — G4452 New daily persistent headache (NDPH): Secondary | ICD-10-CM

## 2024-12-22 ENCOUNTER — Encounter: Payer: Self-pay | Admitting: Internal Medicine

## 2024-12-27 ENCOUNTER — Ambulatory Visit
Admission: RE | Admit: 2024-12-27 | Discharge: 2024-12-27 | Disposition: A | Source: Ambulatory Visit | Attending: Internal Medicine | Admitting: Internal Medicine

## 2024-12-27 DIAGNOSIS — R519 Headache, unspecified: Secondary | ICD-10-CM

## 2024-12-27 DIAGNOSIS — G4452 New daily persistent headache (NDPH): Secondary | ICD-10-CM
